# Patient Record
Sex: Female | Born: 1965 | Race: White | Hispanic: No | Marital: Single | Smoking: Former smoker
Health system: Southern US, Community
[De-identification: ages and names within clinical notes are randomized; demographics above are authoritative.]

## PROBLEM LIST (undated history)

## (undated) DIAGNOSIS — N83201 Unspecified ovarian cyst, right side: Secondary | ICD-10-CM

## (undated) DIAGNOSIS — J209 Acute bronchitis, unspecified: Secondary | ICD-10-CM

## (undated) DIAGNOSIS — Z9889 Other specified postprocedural states: Secondary | ICD-10-CM

## (undated) DIAGNOSIS — Q211 Atrial septal defect, unspecified: Secondary | ICD-10-CM

## (undated) DIAGNOSIS — L299 Pruritus, unspecified: Secondary | ICD-10-CM

## (undated) DIAGNOSIS — M7542 Impingement syndrome of left shoulder: Secondary | ICD-10-CM

## (undated) DIAGNOSIS — K429 Umbilical hernia without obstruction or gangrene: Secondary | ICD-10-CM

## (undated) DIAGNOSIS — R52 Pain, unspecified: Secondary | ICD-10-CM

## (undated) DIAGNOSIS — R197 Diarrhea, unspecified: Secondary | ICD-10-CM

## (undated) DIAGNOSIS — R059 Cough, unspecified: Secondary | ICD-10-CM

## (undated) DIAGNOSIS — I639 Cerebral infarction, unspecified: Secondary | ICD-10-CM

## (undated) DIAGNOSIS — E875 Hyperkalemia: Secondary | ICD-10-CM

## (undated) DIAGNOSIS — Q2112 Patent foramen ovale: Secondary | ICD-10-CM

## (undated) DIAGNOSIS — R011 Cardiac murmur, unspecified: Secondary | ICD-10-CM

## (undated) DIAGNOSIS — M549 Dorsalgia, unspecified: Secondary | ICD-10-CM

## (undated) DIAGNOSIS — R569 Unspecified convulsions: Secondary | ICD-10-CM

## (undated) DIAGNOSIS — R0781 Pleurodynia: Secondary | ICD-10-CM

## (undated) DIAGNOSIS — B029 Zoster without complications: Secondary | ICD-10-CM

## (undated) DIAGNOSIS — Z6828 Body mass index (BMI) 28.0-28.9, adult: Secondary | ICD-10-CM

## (undated) DIAGNOSIS — I10 Essential (primary) hypertension: Secondary | ICD-10-CM

## (undated) DIAGNOSIS — F419 Anxiety disorder, unspecified: Secondary | ICD-10-CM

## (undated) DIAGNOSIS — E119 Type 2 diabetes mellitus without complications: Secondary | ICD-10-CM

## (undated) DIAGNOSIS — R519 Headache, unspecified: Secondary | ICD-10-CM

## (undated) DIAGNOSIS — G459 Transient cerebral ischemic attack, unspecified: Secondary | ICD-10-CM

## (undated) DIAGNOSIS — F32A Depression, unspecified: Secondary | ICD-10-CM

## (undated) DIAGNOSIS — J069 Acute upper respiratory infection, unspecified: Secondary | ICD-10-CM

## (undated) DIAGNOSIS — J45909 Unspecified asthma, uncomplicated: Secondary | ICD-10-CM

## (undated) DIAGNOSIS — G4700 Insomnia, unspecified: Secondary | ICD-10-CM

## (undated) DIAGNOSIS — M7742 Metatarsalgia, left foot: Secondary | ICD-10-CM

## (undated) DIAGNOSIS — R05 Cough: Secondary | ICD-10-CM

## (undated) DIAGNOSIS — Z794 Long term (current) use of insulin: Secondary | ICD-10-CM

## (undated) DIAGNOSIS — R319 Hematuria, unspecified: Secondary | ICD-10-CM

## (undated) DIAGNOSIS — M94212 Chondromalacia, left shoulder: Secondary | ICD-10-CM

## (undated) DIAGNOSIS — K219 Gastro-esophageal reflux disease without esophagitis: Secondary | ICD-10-CM

## (undated) DIAGNOSIS — D1803 Hemangioma of intra-abdominal structures: Secondary | ICD-10-CM

## (undated) DIAGNOSIS — M65319 Trigger thumb, unspecified thumb: Secondary | ICD-10-CM

## (undated) DIAGNOSIS — E785 Hyperlipidemia, unspecified: Secondary | ICD-10-CM

## (undated) DIAGNOSIS — F329 Major depressive disorder, single episode, unspecified: Secondary | ICD-10-CM

## (undated) DIAGNOSIS — M940 Chondrocostal junction syndrome [Tietze]: Secondary | ICD-10-CM

## (undated) DIAGNOSIS — R1011 Right upper quadrant pain: Secondary | ICD-10-CM

## (undated) DIAGNOSIS — S79922A Unspecified injury of left thigh, initial encounter: Secondary | ICD-10-CM

## (undated) DIAGNOSIS — N951 Menopausal and female climacteric states: Secondary | ICD-10-CM

## (undated) DIAGNOSIS — J309 Allergic rhinitis, unspecified: Secondary | ICD-10-CM

## (undated) DIAGNOSIS — K469 Unspecified abdominal hernia without obstruction or gangrene: Secondary | ICD-10-CM

## (undated) DIAGNOSIS — K5900 Constipation, unspecified: Secondary | ICD-10-CM

## (undated) DIAGNOSIS — R112 Nausea with vomiting, unspecified: Secondary | ICD-10-CM

## (undated) DIAGNOSIS — E860 Dehydration: Secondary | ICD-10-CM

## (undated) DIAGNOSIS — IMO0001 Reserved for inherently not codable concepts without codable children: Secondary | ICD-10-CM

## (undated) DIAGNOSIS — E782 Mixed hyperlipidemia: Secondary | ICD-10-CM

## (undated) DIAGNOSIS — D259 Leiomyoma of uterus, unspecified: Secondary | ICD-10-CM

## (undated) HISTORY — DX: Menopausal and female climacteric states: N95.1

## (undated) HISTORY — DX: Hyperkalemia: E87.5

## (undated) HISTORY — DX: Dehydration: E86.0

## (undated) HISTORY — DX: Hemangioma of intra-abdominal structures: D18.03

## (undated) HISTORY — DX: Transient cerebral ischemic attack, unspecified: G45.9

## (undated) HISTORY — DX: Unspecified abdominal hernia without obstruction or gangrene: K46.9

## (undated) HISTORY — DX: Right upper quadrant pain: R10.11

## (undated) HISTORY — DX: Type 2 diabetes mellitus without complications: E11.9

## (undated) HISTORY — DX: Mixed hyperlipidemia: E78.2

## (undated) HISTORY — PX: OTHER SURGICAL HISTORY: SHX169

## (undated) HISTORY — DX: Acute upper respiratory infection, unspecified: J06.9

## (undated) HISTORY — DX: Metatarsalgia, left foot: M77.42

## (undated) HISTORY — DX: Dorsalgia, unspecified: M54.9

## (undated) HISTORY — DX: Trigger thumb, unspecified thumb: M65.319

## (undated) HISTORY — DX: Cough, unspecified: R05.9

## (undated) HISTORY — DX: Pruritus, unspecified: L29.9

## (undated) HISTORY — DX: Pleurodynia: R07.81

## (undated) HISTORY — DX: Allergic rhinitis, unspecified: J30.9

## (undated) HISTORY — DX: Leiomyoma of uterus, unspecified: D25.9

## (undated) HISTORY — DX: Unspecified injury of left thigh, initial encounter: S79.922A

## (undated) HISTORY — DX: Insomnia, unspecified: G47.00

## (undated) HISTORY — DX: Hyperlipidemia, unspecified: E78.5

## (undated) HISTORY — PX: FOREIGN BODY REMOVAL: SHX962

## (undated) HISTORY — DX: Diarrhea, unspecified: R19.7

## (undated) HISTORY — DX: Long term (current) use of insulin: Z79.4

## (undated) HISTORY — DX: Hematuria, unspecified: R31.9

## (undated) HISTORY — DX: Zoster without complications: B02.9

## (undated) HISTORY — DX: Umbilical hernia without obstruction or gangrene: K42.9

## (undated) HISTORY — DX: Headache, unspecified: R51.9

## (undated) HISTORY — PX: TRIGGER FINGER RELEASE: SHX641

## (undated) HISTORY — DX: Chondromalacia, left shoulder: M94.212

## (undated) HISTORY — DX: Reserved for inherently not codable concepts without codable children: IMO0001

## (undated) HISTORY — DX: Constipation, unspecified: K59.00

## (undated) HISTORY — DX: Impingement syndrome of left shoulder: M75.42

## (undated) HISTORY — DX: Atrial septal defect: Q21.1

## (undated) HISTORY — DX: Anxiety disorder, unspecified: F41.9

## (undated) HISTORY — DX: Essential (primary) hypertension: I10

## (undated) HISTORY — DX: Chondrocostal junction syndrome (tietze): M94.0

## (undated) HISTORY — DX: Unspecified ovarian cyst, right side: N83.201

## (undated) HISTORY — DX: Atrial septal defect, unspecified: Q21.10

## (undated) HISTORY — DX: Acute bronchitis, unspecified: J20.9

## (undated) HISTORY — DX: Gastro-esophageal reflux disease without esophagitis: K21.9

## (undated) HISTORY — PX: HERNIA REPAIR: SHX51

## (undated) HISTORY — DX: Pain, unspecified: R52

## (undated) HISTORY — DX: Body mass index (BMI) 28.0-28.9, adult: Z68.28

---
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
## 1998-06-03 ENCOUNTER — Encounter: Payer: Self-pay | Admitting: Emergency Medicine

## 1998-06-03 ENCOUNTER — Inpatient Hospital Stay (HOSPITAL_COMMUNITY): Admission: EM | Admit: 1998-06-03 | Discharge: 1998-06-05 | Payer: Self-pay | Admitting: Emergency Medicine

## 1998-06-04 ENCOUNTER — Encounter: Payer: Self-pay | Admitting: Emergency Medicine

## 1999-07-16 ENCOUNTER — Other Ambulatory Visit: Admission: RE | Admit: 1999-07-16 | Discharge: 1999-07-16 | Payer: Self-pay | Admitting: Obstetrics & Gynecology

## 1999-07-24 ENCOUNTER — Encounter: Admission: RE | Admit: 1999-07-24 | Discharge: 1999-10-22 | Payer: Self-pay | Admitting: *Deleted

## 1999-08-12 ENCOUNTER — Encounter (INDEPENDENT_AMBULATORY_CARE_PROVIDER_SITE_OTHER): Payer: Self-pay | Admitting: Specialist

## 1999-08-12 ENCOUNTER — Ambulatory Visit (HOSPITAL_COMMUNITY): Admission: RE | Admit: 1999-08-12 | Discharge: 1999-08-12 | Payer: Self-pay | Admitting: Obstetrics & Gynecology

## 2001-10-13 ENCOUNTER — Ambulatory Visit (HOSPITAL_BASED_OUTPATIENT_CLINIC_OR_DEPARTMENT_OTHER): Admission: RE | Admit: 2001-10-13 | Discharge: 2001-10-13 | Payer: Self-pay | Admitting: Orthopedic Surgery

## 2003-01-28 ENCOUNTER — Emergency Department (HOSPITAL_COMMUNITY): Admission: EM | Admit: 2003-01-28 | Discharge: 2003-01-28 | Payer: Self-pay | Admitting: Emergency Medicine

## 2003-10-04 ENCOUNTER — Other Ambulatory Visit: Admission: RE | Admit: 2003-10-04 | Discharge: 2003-10-04 | Payer: Self-pay | Admitting: Family Medicine

## 2004-01-15 ENCOUNTER — Ambulatory Visit (HOSPITAL_BASED_OUTPATIENT_CLINIC_OR_DEPARTMENT_OTHER): Admission: RE | Admit: 2004-01-15 | Discharge: 2004-01-15 | Payer: Self-pay | Admitting: Orthopedic Surgery

## 2004-01-15 ENCOUNTER — Ambulatory Visit (HOSPITAL_COMMUNITY): Admission: RE | Admit: 2004-01-15 | Discharge: 2004-01-15 | Payer: Self-pay | Admitting: Orthopedic Surgery

## 2005-07-10 ENCOUNTER — Encounter (INDEPENDENT_AMBULATORY_CARE_PROVIDER_SITE_OTHER): Payer: Self-pay | Admitting: *Deleted

## 2005-07-10 ENCOUNTER — Encounter: Admission: RE | Admit: 2005-07-10 | Discharge: 2005-07-10 | Payer: Self-pay | Admitting: Family Medicine

## 2005-07-25 ENCOUNTER — Encounter: Admission: RE | Admit: 2005-07-25 | Discharge: 2005-07-25 | Payer: Self-pay | Admitting: Family Medicine

## 2005-11-29 ENCOUNTER — Emergency Department (HOSPITAL_COMMUNITY): Admission: EM | Admit: 2005-11-29 | Discharge: 2005-11-29 | Payer: Self-pay | Admitting: Emergency Medicine

## 2008-07-25 ENCOUNTER — Inpatient Hospital Stay (HOSPITAL_COMMUNITY): Admission: EM | Admit: 2008-07-25 | Discharge: 2008-07-27 | Payer: Self-pay | Admitting: Emergency Medicine

## 2008-07-25 ENCOUNTER — Ambulatory Visit: Payer: Self-pay | Admitting: Cardiology

## 2008-07-26 ENCOUNTER — Encounter (INDEPENDENT_AMBULATORY_CARE_PROVIDER_SITE_OTHER): Payer: Self-pay | Admitting: *Deleted

## 2008-07-26 ENCOUNTER — Ambulatory Visit: Payer: Self-pay | Admitting: Surgery

## 2008-08-01 ENCOUNTER — Ambulatory Visit (HOSPITAL_COMMUNITY): Admission: RE | Admit: 2008-08-01 | Discharge: 2008-08-01 | Payer: Self-pay | Admitting: Neurology

## 2008-11-16 ENCOUNTER — Ambulatory Visit (HOSPITAL_COMMUNITY): Admission: RE | Admit: 2008-11-16 | Discharge: 2008-11-16 | Payer: Self-pay | Admitting: Cardiology

## 2008-11-16 ENCOUNTER — Encounter: Payer: Self-pay | Admitting: Cardiovascular Disease

## 2008-11-16 ENCOUNTER — Encounter (INDEPENDENT_AMBULATORY_CARE_PROVIDER_SITE_OTHER): Payer: Self-pay | Admitting: Cardiology

## 2009-03-30 DIAGNOSIS — I10 Essential (primary) hypertension: Secondary | ICD-10-CM | POA: Insufficient documentation

## 2009-03-30 DIAGNOSIS — E119 Type 2 diabetes mellitus without complications: Secondary | ICD-10-CM | POA: Insufficient documentation

## 2009-03-30 DIAGNOSIS — G459 Transient cerebral ischemic attack, unspecified: Secondary | ICD-10-CM | POA: Insufficient documentation

## 2009-03-30 DIAGNOSIS — F17200 Nicotine dependence, unspecified, uncomplicated: Secondary | ICD-10-CM | POA: Insufficient documentation

## 2009-04-03 ENCOUNTER — Ambulatory Visit: Payer: Self-pay | Admitting: Cardiovascular Disease

## 2009-04-03 ENCOUNTER — Telehealth: Payer: Self-pay | Admitting: Internal Medicine

## 2009-04-03 DIAGNOSIS — K5900 Constipation, unspecified: Secondary | ICD-10-CM | POA: Insufficient documentation

## 2009-04-03 DIAGNOSIS — R0602 Shortness of breath: Secondary | ICD-10-CM | POA: Insufficient documentation

## 2009-04-05 ENCOUNTER — Telehealth (INDEPENDENT_AMBULATORY_CARE_PROVIDER_SITE_OTHER): Payer: Self-pay | Admitting: *Deleted

## 2009-04-09 ENCOUNTER — Encounter: Admission: RE | Admit: 2009-04-09 | Discharge: 2009-04-09 | Payer: Self-pay | Admitting: Family Medicine

## 2009-04-09 ENCOUNTER — Ambulatory Visit: Payer: Self-pay

## 2009-04-09 ENCOUNTER — Encounter: Payer: Self-pay | Admitting: Cardiovascular Disease

## 2009-04-09 ENCOUNTER — Encounter (INDEPENDENT_AMBULATORY_CARE_PROVIDER_SITE_OTHER): Payer: Self-pay | Admitting: *Deleted

## 2009-04-25 ENCOUNTER — Ambulatory Visit: Payer: Self-pay | Admitting: Internal Medicine

## 2009-04-25 DIAGNOSIS — N643 Galactorrhea not associated with childbirth: Secondary | ICD-10-CM | POA: Insufficient documentation

## 2009-04-26 LAB — CONVERTED CEMR LAB
Prolactin: 10 ng/mL
TSH: 0.94 microintl units/mL (ref 0.35–5.50)

## 2009-04-30 ENCOUNTER — Ambulatory Visit (HOSPITAL_COMMUNITY): Admission: RE | Admit: 2009-04-30 | Discharge: 2009-04-30 | Payer: Self-pay | Admitting: Internal Medicine

## 2009-05-01 ENCOUNTER — Telehealth: Payer: Self-pay | Admitting: Internal Medicine

## 2009-05-11 ENCOUNTER — Ambulatory Visit: Payer: Self-pay | Admitting: Internal Medicine

## 2009-07-20 ENCOUNTER — Encounter (INDEPENDENT_AMBULATORY_CARE_PROVIDER_SITE_OTHER): Payer: Self-pay | Admitting: *Deleted

## 2010-03-19 ENCOUNTER — Encounter: Admission: RE | Admit: 2010-03-19 | Discharge: 2010-03-19 | Payer: Self-pay | Admitting: Family Medicine

## 2010-10-07 ENCOUNTER — Ambulatory Visit: Admit: 2010-10-07 | Payer: Self-pay | Admitting: Family Medicine

## 2010-10-22 ENCOUNTER — Ambulatory Visit (INDEPENDENT_AMBULATORY_CARE_PROVIDER_SITE_OTHER): Payer: BC Managed Care – PPO | Admitting: Family Medicine

## 2010-10-22 ENCOUNTER — Encounter: Payer: Self-pay | Admitting: Family Medicine

## 2010-10-22 DIAGNOSIS — G459 Transient cerebral ischemic attack, unspecified: Secondary | ICD-10-CM

## 2010-10-22 DIAGNOSIS — E109 Type 1 diabetes mellitus without complications: Secondary | ICD-10-CM | POA: Insufficient documentation

## 2010-10-22 DIAGNOSIS — Q211 Atrial septal defect: Secondary | ICD-10-CM

## 2010-10-22 DIAGNOSIS — F411 Generalized anxiety disorder: Secondary | ICD-10-CM | POA: Insufficient documentation

## 2010-10-30 NOTE — Assessment & Plan Note (Signed)
Summary: new to est--[phone]   Vital Signs:  Patient profile:   46 year old female Height:      62 inches Weight:      149 pounds BMI:     27.35 Pulse rate:   66 / minute BP sitting:   130 / 80  (left arm)  Vitals Entered By: Doristine Devoid CMA (October 22, 2010 9:59 AM) CC: new est-    History of Present Illness: 45 yo woman here today to establish care.  previous MD- Larina Bras.  1) Diabetes Type I- dx'd at age 26, using insulin pump.  sees Dr Altheimer.  has proliferative diabetic retinopathy, seeing Dr Cherene Julian at [facility] at least yearly.  vision will fluctuate w/ sugar.  no renal involvement, no neuropathy.  2) ? TIA- 2010, has been following w/ Dr Anne Hahn, had TEE, bubble test and was dx'd w/ PFO.  has no residual effects.  not on cholesterol medication.  3) PFO- seeing Dr Eden Emms.  on 2 ASA daily.  4) Anxiety- on xanax as needed.  has been on SSRIs '2-3 different times'.  effexor 'made me nuts and then i had horrible withdraw sxs'.  feels sxs are currently well controlled.  Preventive Screening-Counseling & Management  Alcohol-Tobacco     Alcohol drinks/day: <1     Smoking Status: quit     Year Quit: 2010  Caffeine-Diet-Exercise     Does Patient Exercise: yes      Sexual History:  currently monogamous.        Drug Use:  former.    Current Medications (verified): 1)  Novolin N 100 Unit/ml Susp (Insulin Isophane Human) .... Pump As Diretced 2)  Aspirin 81 Mg Tbec (Aspirin) .... 2 Tabs By Mouth Once Daily 3)  Ramipril 10 Mg Caps (Ramipril) .... Take One Capsule By Mouth Daily 4)  Multivitamins   Tabs (Multiple Vitamin) .Marland Kitchen.. 1 Tab By Mouth Once Daily 5)  Magnesium 30 Mg Tabs (Magnesium) .... As Needed 6)  Stool Softener 100 Mg Caps (Docusate Sodium) .... As Needed 7)  Alprazolam 0.5 Mg Tabs (Alprazolam) .... Take 1-2 Tablets As Needed  Allergies (verified): 1)  ! * Lantus 2)  ! * Nickel  Past History:  Past medical, surgical, family and social histories  (including risk factors) reviewed, and no changes noted (except as noted below).  Past Medical History: Reviewed history from 04/25/2009 and no changes required. IDDM:  insulin pump Hypertension ?TIA Confusion CONSTIPATION (ICD-564.00) DYSPNEA (ICD-786.05) SMOKER (ICD-305.1) TIA (ICD-435.9) HYPERTENSION (ICD-401.9) DM (ICD-250.00)   Anxiety Disorder Chronic Headaches Seizures  Past Surgical History: Reviewed history from 03/30/2009 and no changes required.  hernia repair   Removal of deep foreign body, right index finger.  Family History: Reviewed history from 04/25/2009 and no changes required. DM- mother Family History of Colon Cancer:MGF Family History of Ovarian Cancer:Paternal Aunt Breast Cancer- no  Social History: Reviewed history from 04/25/2009 and no changes required. in relationship works at [employer] no children 2 cats Former smoker Alcohol Use - yes-2-3x/week socially Daily Caffeine Use---2 or 3 daily  Smoking Status:  quit Does Patient Exercise:  yes Drug Use:  former Sexual History:  currently monogamous  Review of Systems General:  Denies chills, fatigue, fever, malaise, sweats, and weakness. Eyes:  Complains of blurring. ENT:  Denies decreased hearing, nosebleeds, and ringing in ears. CV:  Denies chest pain or discomfort, fainting, fatigue, lightheadness, palpitations, shortness of breath with exertion, swelling of feet, and swelling of hands. Resp:  Denies shortness of  breath and sputum productive. GI:  Denies abdominal pain, nausea, and vomiting. Derm:  Denies changes in color of skin and poor wound healing. Neuro:  Complains of visual disturbances; denies headaches, memory loss, numbness, tingling, and weakness. Psych:  Denies anxiety, depression, easily angered, easily tearful, irritability, and mental problems.  Physical Exam  General:  Well-developed,well-nourished,in no acute distress; alert,appropriate and cooperative throughout  examination Head:  Normocephalic and atraumatic without obvious abnormalities. No apparent alopecia or balding. Eyes:  PERRL, EOMI Nose:  External nasal examination shows no deformity or inflammation. Nasal mucosa are pink and moist without lesions or exudates. Mouth:  MMM Neck:  No deformities, masses, or tenderness noted. Lungs:  Normal respiratory effort, chest expands symmetrically. Lungs are clear to auscultation, no crackles or wheezes. Heart:  Normal rate and regular rhythm. S1 and S2 normal without gallop, murmur, click, rub or other extra sounds. Abdomen:  soft, NT/ND, +BS Pulses:  +2 carotid, radial, DP Extremities:  No clubbing, cyanosis, edema, or deformity noted with normal full range of motion of all joints.   Neurologic:  alert & oriented X3, cranial nerves II-XII intact, sensation intact to light touch, gait normal, and DTRs symmetrical and normal.   Skin:  turgor normal and color normal.   Cervical Nodes:  No lymphadenopathy noted Psych:  Cognition and judgment appear intact. Alert and cooperative with normal attention span and concentration. No apparent delusions, illusions, hallucinations   Impression & Recommendations:  Problem # 1:  DIABETES MELLITUS, TYPE I (ICD-250.01) Assessment New following w/ Endo.  doing well.  asked pt to have Dr Altheimer send his office notes and labs so that i can follow along. Her updated medication list for this problem includes:    Novolin N 100 Unit/ml Susp (Insulin isophane human) .Marland Kitchen... Pump as diretced    Aspirin 81 Mg Tbec (Aspirin) .Marland Kitchen... 2 tabs by mouth once daily    Ramipril 10 Mg Caps (Ramipril) .Marland Kitchen... Take one capsule by mouth daily  Problem # 2:  TIA (ICD-435.9) following w/ Dr Anne Hahn.  no residual defects.  will need to review notes from previous MD to determine control of risk factors (lipids) Her updated medication list for this problem includes:    Aspirin 81 Mg Tbec (Aspirin) .Marland Kitchen... 2 tabs by mouth once daily  Problem #  3:  PATENT FORAMEN OVALE (ICD-745.5) Assessment: New following w/ Dr Eden Emms.  on 2 ASA daily as prophylaxis.  Problem # 4:  ANXIETY STATE, UNSPECIFIED (ICD-300.00) Assessment: New pt feels sxs are well controlled.  not interested in controller medication at this time.  will follow. Her updated medication list for this problem includes:    Alprazolam 0.5 Mg Tabs (Alprazolam) .Marland Kitchen... Take 1-2 tablets as needed  Complete Medication List: 1)  Novolin N 100 Unit/ml Susp (Insulin isophane human) .... Pump as diretced 2)  Aspirin 81 Mg Tbec (Aspirin) .... 2 tabs by mouth once daily 3)  Ramipril 10 Mg Caps (Ramipril) .... Take one capsule by mouth daily 4)  Multivitamins Tabs (Multiple vitamin) .Marland Kitchen.. 1 tab by mouth once daily 5)  Magnesium 30 Mg Tabs (Magnesium) .... As needed 6)  Stool Softener 100 Mg Caps (Docusate sodium) .... As needed 7)  Alprazolam 0.5 Mg Tabs (Alprazolam) .... Take 1-2 tablets as needed  Patient Instructions: 1)  Plan on your complete physical in May 2)  Please have Dr Altheimer send me copies of labs and office notes 3)  You look great! 4)  Call with any questions or concerns 5)  Welcome!  We're glad to have you!!!   Orders Added: 1)  New Patient Level III [99203]   Immunization History:  Pneumovax Immunization History:    Pneumovax:  historical (09/08/2008)   Immunization History:  Pneumovax Immunization History:    Pneumovax:  Historical (09/08/2008)

## 2010-10-31 ENCOUNTER — Encounter (INDEPENDENT_AMBULATORY_CARE_PROVIDER_SITE_OTHER): Payer: Self-pay | Admitting: *Deleted

## 2010-11-05 NOTE — Miscellaneous (Signed)
  Clinical Lists Changes  Observations: Added new observation of TD BOOSTER: Historical (10/20/2010 15:08)      Immunization History:  Tetanus/Td Immunization History:    Tetanus/Td:  historical (10/20/2010)

## 2010-12-19 LAB — GLUCOSE, CAPILLARY
Glucose-Capillary: 103 mg/dL — ABNORMAL HIGH (ref 70–99)
Glucose-Capillary: 79 mg/dL (ref 70–99)

## 2011-01-21 NOTE — H&P (Signed)
NAMEMARVIN, Natalie Ware                  ACCOUNT NO.:  [id number]   MEDICAL RECORD NO.:  [id number]          PATIENT TYPE:  EMS   LOCATION:  ED                           [facility]   PHYSICIAN:  Estelle Grumbles, MDDATE OF BIRTH:  01/22/1966   DATE OF ADMISSION:  07/25/2008  DATE OF DISCHARGE:                              HISTORY & PHYSICAL   PRIMARY CARE PHYSICIAN:  Dr. Smith Mince.   CHIEF COMPLAINT:  On and off confusion.   HISTORY OF PRESENT ILLNESS:  Natalie Ware is a 45 year old female with a  history of hypertension, insulin dependent diabetes mellitus, who  presented to the emergency room with off and on confusion, imbalance and  right upper extremity numbness.  According to the patient, she started  noticing symptoms yesterday afternoon when she was talking to her  friend.  She suddenly started talking inappropriately.  She does not  remember what she talked about at that time.  Subsequently, her friend  told her she was talking inappropriately and she does not remember what  she was talking.  At that time, she also started having right upper  extremity numbness.  She had some imbalance at that time.  The symptoms  have resolved and she came back to her baseline.  However, she does have  an on and off imbalance and the right upper extremity numbness was  continuous.  She was also feeling forgetful today.  As the symptoms were  persistent, she presented to the emergency room.  The patient denies  having any fevers.  No headache, dizziness, light-headedness.   REVIEW OF SYSTEMS:  As per the history of present illness.  The patient  denies having any dysphagia, dysarthria.  No visual or auditory  symptoms.  Denies having any neck pain.  No chest pain.  No dyspnea.  No  cough.  No orthopnea.  No nocturnal dyspnea.  Denies having any  abdominal pain.  No nausea or vomiting.  Denies having any dysuria.  No  frequency or chest heaviness.  Denies having any constipation or  diarrhea.  No myalgia or arthralgias.  Denies any weakness, tingling, or  numbness in her lower extremities.  Does have numbness in the right  upper extremity.  Other review of systems were asked and negative.   PAST HISTORY:  Hypertension, insulin dependent diabetes mellitus for the  past 30 years.   SOCIAL HISTORY:  She usually smokes 1/2 to 1 pack of cigarettes a day  for the past 1-1/2 years.  She used to smoke sporadically.  She also  drinks alcohol off and on.  Denies any drug use.   FAMILY HISTORY:  Her mother has diabetes.  Denies any significant  medical issues.   HOME MEDICATIONS:  1. She was taking insulin pump.  2. Altace 5 mg daily.  3. Birth control pills.   PHYSICAL EXAMINATION:  CONSTITUTIONAL:  The patient is well developed,  well nourished NAD.  VITAL SIGNS:  Temperature 98.8, pulse 101, respirations 18, blood  pressure 156/91.  HEENT:  Normocephalic, atraumatic.  Pupils are equal, round, and  reactive to  light and accommodation.  No icterus noted.  Oral cavity,  moist oral mucosa.  NECK:  Supple.  No JVD was noted.  CHEST:  Bilateral air entry.  No crackles or wheezes.  HEART:  S1, S2.  Regular rate and rhythm.  No murmurs were noted.  ABDOMEN:  Soft.  Bowel sounds positive.  Nontender, nondistended.  No  guarding or rigidity.  EXTREMITIES:  No edema.  CNS:  No focal motor or neuro deficits.  Motor is 5/5 in both upper and  lower extremities.  Cranial nerves 2-12 are intact.  Sensory, does have  decreased sensation in the right upper extremity.  Deep tendon reflexes  are 1+ and equal.  Finger to nose tests are normal.   LABS:  White blood cell count 11.2, hemoglobin 14.3, hematocrit 42.5,  and platelets 292,000.  Sodium 136, potassium 3.4, chloride 102, bicarb  25, glucose 300, BUN 13, creatinine 0.84, CK-MB less than 1, troponin  less than 0.05.  Urine hCG is negative.  Urine drug screen is positive  for tetrahydrocannabinol.  Urinalysis negative for  leukocyte esterase  and nitrate.  EKG sinus rhythm, no acute ST-T wave changes.  CT head  reveals normal study.  No acute process radiographically.   IMPRESSION:  Off and on mental status changes with right upper extremity  sensory changes, likely secondary to transient ischemic attack.  The  patient might have cerebrovascular accident.  No significant neurologic  defects were noted at this time.  Will obtain an MRI of the brain, echo  carotid Doppler.  Will start the patient on aspirin.  Will obtain  fasting lipid profile.  Continue the insulin pump and follow Glucometer  prior to meals and at bedtime, and give subcutaneous insulin as needed.  Supplement her fluids as needed.  Will also obtain ammonia levels.  If  the patient continues to have mental status changes, will need to  consider neurological evaluation.      Estelle Grumbles, MD  Electronically Signed     TP/MEDQ  D:  07/25/2008  T:  07/25/2008  Job:  [id number]

## 2011-01-21 NOTE — Consult Note (Signed)
Natalie Ware, Natalie Ware                  ACCOUNT NO.:  [id number]   MEDICAL RECORD NO.:  [id number]          PATIENT TYPE:  INP   LOCATION:  1442                         FACILITY:  [facility]   PHYSICIAN:  Marlan Palau, M.D.  DATE OF BIRTH:  08-02-1966   DATE OF CONSULTATION:  07/26/2008  DATE OF DISCHARGE:                                 CONSULTATION   CHIEF COMPLAINT:  Paresthesias of right arm and altered mental status.   REASON FOR CONSULTATION:  Paresthesias of right arm.   HISTORY OF PRESENT ILLNESS:  This is a 45 year old female with history  of hypertension, diabetes with insulin pump.  She was admitted to the ED  of [facility] on 11/17 secondary to altered mental status, imbalance,  and right upper extremity numbness.  The patient states that on 11/16  she was at work and she works at [employer].  She was on the floor hanging  lingerie when she felt a significant amount of dizziness and imbalance  for approximately 1 minute.  The patient states it was severe enough  that she had to hold onto objects around her to keep her balance.  This  period last for approximately a minute.  The patient states during this  time her right leg also felt very heavy; however she was able to move it  at that time.  Approximately 30 minutes later she was at the cash  register looking down and had another incident of significant dizziness  that lasted for 30 seconds.  She states after this episode she noticed  that she was having right arm tingling and burning that extended from  her fingertips up to her underarm.  She stated that it was more  significant at her palmar region and fingertips than at her proximal  aspect of her right arm.  The patient checked her blood glucose at that  time and it was 200.  The patient also states that during that first  attack her coworkers noted she was saying abnormal words that she does  not remember saying.  Although she has no history of seizure, she has  had a  hypoglycemic seizure event.  Later that day the patient had driven  home with no focal neuro deficits other than her right arm numbness and  tingling.  The patient states that she had no loss of consciousness  during these events.  Also she states she had not been getting up from a  stoop position or getting up from a seated or a lying position and her  blood pressure remained approximately 140/60 at that time as she took it  when she got home.  On November 17 she woke up, noticed that she had a  couple more dizzy spells and her right arm had not improved and decided  to drive to [facility] ER.  While she has been in the hospital she said  she has not had any of these events.  She states that her blood glucose  ranges from 100s up to high 200s; however she does have an hbA1c of  7.7.  She does smoke THC recreationally, however she stated that she had  THC on the night of the 16th which was post numbness and tingling and  dizzy spells.   PAST MEDICAL HISTORY:  Significant for:  1. Hypertension.  2. Diabetes type 1 insulin-dependent.  Hypoglycemic events in the      past.   MEDICATIONS:  1. Altace 5 mg every day.  2. Insulin pump.  3. Camellia oral.  4. While in the hospital Reglan 10 mg IV every 8 hours.   ALLERGIES:  LANTUS INSULIN.   SOCIAL HISTORY:  She does smoke approximately 1/2 to a pack a day.  She  has been doing this for approximately 5 years.  Alcohol she drinks  periodically and illicit drugs as stated she does partake in [facility].   REVIEW OF SYSTEMS:  Were negative with the exception of above.   PHYSICAL EXAM:  Blood pressure at time of interview was 118/75 and has  remained within 120-130 systolic and 75-80 diastolic during her hospital  stay.  NIH scale showed a score of 1.  Pulse 65.  Respirations 18 and  temperature 98.3.  MENTAL STATUS:  She is alert and oriented x3.  She can carry out 1, 2  and 3 step commands without any difficulty.  CRANIAL NERVES:  Pupils   equal, round, reactive, accommodating to light.  Conjugate gaze.  Extraocular muscles are intact.  Visual fields are fully intact.  Tongue  is midline.  Face is symmetrical.  Uvula is midline.  She has no  aphasia, dysarthria, slurred speech at this time.  Sensation V1-V3 is  full.  Head shrug, shoulder turn is normal.  Coordination, finger-to-  nose, heel-to-shin, fine motor movements normal.  Gait is normal at this  time.  Motor, she has full strength 5 out of 5 globally.  She has no  tremors.  No clonus.  No asterixis.  She has good bulk and tone.   Deep tendon reflexes are 2+ bilateral upper extremity,  3+ bilateral at  the patella bilateral downgoing toes and 1+ bilateral  Achilles__________drift.  She has negative drift bilaterally upper  extremities or lower extremities.  Sensation, she states that she has  decreased sensation to pinprick only over her right arm and this is more  pronounced in the palm aspect of her right hand and become more  sensitive as you go proximally up her right arm to her jaw line.  At her  jaw line she states that bilaterally are equal.  As stated she has  decreased sensation on her right arm compared to her left arm and the  decreased sensation on her right arm is more pronounced in the palmar  aspect of her right hand.  All other areas on her body are normal to  pinprick, light touch, and vibration.   LABS:  At this time, PT/INR and PTT is 13.2, 1.0 and 27, hbA1c is 7.7,  alkaline phosphatase 35, AST and ALTs are normal.  UA showed positive  for glucose, no infection.  Sodium 138, potassium 3.8, chloride 106, CO2  is 24, BUN 11, creatinine 0.79, glucose is down from initial 300 when  she was admitted to 133.  White blood cell count is 11.2, platelets 292,  hemoglobin and hematocrit 14.3 and 42.5, triglycerides 84, cholesterol  128, HDL 34, LDL 77.  B12 is 184 and urine drug screen was positive for  THC.  Imaging MRA showed no lesion, stenosis or  aneurysm.  There was an  abnormality of no identified left PICA, however, this could be an  anatomical variation.  MRI of the head showed no ischemic, within normal  limits.  CT of the head showed normal study.  Carotid Doppler  preliminary showed no ICA stenosis.  A 2-D echo is pending.   ASSESSMENT:  The patient's symptoms could be secondary transient  ischemic attack or seizure as patient does not have a history of  migraines.  Images present show no sign of cerebrovascular accident  which makes a seizure very possible; however cannot rule out transient  ischemic attack.  At this time we will obtain an EEG and a homocystine  level while in the hospital.  If her numbness continuous in her right  arm, could consider nerve conduction velocity  test outpatient.  Most likely this is not a median nerve carpal tunnel  syndrome as the patient states that it was a sudden onset of numbness  and tingling throughout her whole arm and not just in the median nerve  distribution.  I will discuss all these findings with Dr. Anne Hahn and  will continue to follow the patient while she is in hospital.     ______________________________  Felicie Morn, PA-C      C. Lesia Sago, M.D.  Electronically Signed    DS/MEDQ  D:  07/26/2008  T:  07/26/2008  Job:  [id number]   cc:   Cherylann Parr TEAM F

## 2011-01-21 NOTE — Procedures (Signed)
EEG NUMBER:  [id number].   DATE OF STUDY:  July 19, 2008.   ORDERED BY:  Marlan Palau, MD   This is a 45 year old woman with a history of diabetes and a seizure 10  years ago, who has had 2 episodes of dizziness along with numbness in  the right arm, confusion, and speech problems.   MEDICATIONS:  Xanax, Tylenol, aspirin, B12 supplement, Altace, and  Humalog.   This was a routine 17-channel EEG with one channel devoted to EKG  utilizing International 10/20 Lead Placement System.  The patient was  described as being awake and drowsy clinically.  She also appears to be  awake and drowsy electrographically.  The background is consistent with  somewhat low-amplitude 11-Hz alpha activity, which is predominant in the  posterior head regions and reactive to eye opening.  During periods of  drowsiness, there was attenuation of the background with some vertex  sharp activity and occasional slowing.  No interhemispheric asymmetry is  identified.  Both hyperventilation and photic stimulation were  performed, but did not produce any significant change in the background  activity.  EKG monitor reveals relatively regular rhythm with a rate of  78 beats per minute.   CONCLUSION:  Essentially normal awake and drowsy EEG without seizure  activities during the course of today's recording.  Mild diffuse beta  activity is seen throughout, which is probably reflective of Xanax.  Clinical correlation is recommended.      Catherine A. Orlin Hilding, M.D.  Electronically Signed     EAV:[id number]  D:  08/01/2008 16:26:08  T:  08/02/2008 03:42:49  Job #:  [id number]

## 2011-01-24 NOTE — Op Note (Signed)
NAME:  Natalie Ware, Natalie Ware                            ACCOUNT NO.:  [id number]   MEDICAL RECORD NO.:  [id number]                   PATIENT TYPE:  AMB   LOCATION:  DSC                                  FACILITY:  [facility]   PHYSICIAN:  Harvie Junior, M.D.                DATE OF BIRTH:  [birth year]   DATE OF PROCEDURE:  01/15/2004  DATE OF DISCHARGE:                                 OPERATIVE REPORT   PREOPERATIVE DIAGNOSIS:  Retained foreign body, right index.   POSTOPERATIVE DIAGNOSIS:  Retained foreign body, right index.   OPERATION PERFORMED:  Removal of deep foreign body, right index finger.   SURGEON:  Harvie Junior, M.D.   ASSISTANT:  Marshia Ly, P.A.   ANESTHESIA:  General via IV and a regional finger block.   INDICATIONS FOR PROCEDURE:  The patient is a 45 year old female with a  history of having a pencil lead jammed into her finger about 20 years ago.  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  Because of continued complaints of pain and  palpational pain, the patient was taken to the operating room for removal of  this foreign body that could be seen on x-ray.   DESCRIPTION OF PROCEDURE:  The patient was taken to the operating room.  After adequate anesthesia was obtained with a digital block, the patient was  placed supine on the operating table.  The right hand was then prepped and  draped in the usual sterile fashion.  Following this, a curved incision was  made over the index finger.  Subcutaneous tissue dissected down to the level  of the deep structures.  A significant amount of what appeared to be sort of  a mud type material was encountered.  Upon continuing to probe in this area,  a large bursal sac came out into the wound.  This was debrided.  This was  opened on the back table and within the sac, the pencil lead was contained.  At this point the wound was copiously irrigated.  Small amounts more of this  mud appearing material which was probably break down products of graphite  were removed from the finger.  Copious irrigation then was under taken and  then closure of the wound with 4-0 nylon interrupted suture.  Sterile  compressive dressing was applied.  The patient was taken to the recovery  room where she was noted to be in satisfactory condition.  The estimated  blood loss for this procedure was none.                                               Harvie Junior, M.D.    Ranae Plumber  D:  01/15/2004  T:  01/16/2004  Job:  [id number]

## 2011-01-24 NOTE — Op Note (Signed)
[place]. [facility]  Patient:    Natalie Ware, Natalie Ware Visit Number: [id number] MRN: [id number]          Service Type: DSU Location: [facility] Attending Physician:  Milly Jakob Dictated by:   Harvie Junior, M.D. Proc. Date: 10/13/01 Admit Date:  10/13/2001                             Operative Report  PREOPERATIVE DIAGNOSES: 1. Trigger finger, right ring. 2. 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  POSTOPERATIVE DIAGNOSES: 1. Trigger finger, right ring. 2. 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  OPERATION: 1. Right long finger trigger finger release. 2. Injection of extensor carpi ulnaris tendon under anesthesia. 3. Injection of wrist joint under anesthesia.  SURGEON:  Harvie Junior, M.D.  ASSISTANT:  Currie Paris. Thedore Mins.  ANESTHESIA:  IV regional.  INDICATIONS:  Natalie Ware is a 45 year old female with a long history of having had triggering of her right ring finger.  She ultimately had tried anti-inflammatory medications, injection therapy, swelling modification and none of this was successful in controlling her triggering and because of this, she is brought to the operating room for trigger finger release.  During the work-up for trigger finger she was noted to have some fairly significant pain over the ECU tendon and over the triangular fibrocartilage.  There was some question in our mind as to whether as to whether this needed MRI for workup but ultimately felt that probably if it was a positive work-up that the patient would be injected.  Because the patient was coming here for surgery, it was felt that this was most appropriate to be done under anesthesia.  DESCRIPTION OF PROCEDURE:  The patient was brought to the operating room and after satisfactory anesthesia was advanced under IV regional, the patient was placed supine on the operating table.  The right arm was  then prepped and draped in the usual sterile fashion.  Following this, a linear incision was made over the ring finger.  The subcutaneous tissue was dissected down to the level of the tendon sheath which was clearly identified, retractors put in place to protect the digital nerve and digital artery.  At this point, an incision was made in the pulley and the pulley was divided proximally and distally.  At this point, the wound was copiously irrigated and suctioned dry. The tendons were pulled out of the wound to make sure that there was no tendency towards triggering or any hesitancy on the tendon at this point.  Following this, the patients wound was closed with 4-0 nylon interrupted suture.  The ECU tendon was then identified and injected with 3 cc of mixture of lidocaine and 1 cc of 80 mg of Depo-Medrol.  The wrist joint was then identified over the triangle of fibrocartilage in what would be the sixth on our portal.  Then 2 cc of lidocaine and 80 mg of Depo-Medrol was injected into the wrist joint under anesthesia.  At this point, a sterile compressive dressing was applied, and the patient was taken to the recovery room where she was noted to be in satisfactory condition.  Estimated blood loss for the procedure was negligible. Dictated by:   Harvie Junior, M.D. Attending Physician:  Milly Jakob DD:  10/13/01 TD:  10/13/01 Job: [id number]

## 2011-02-17 ENCOUNTER — Ambulatory Visit (INDEPENDENT_AMBULATORY_CARE_PROVIDER_SITE_OTHER): Payer: BC Managed Care – PPO | Admitting: Family Medicine

## 2011-02-17 ENCOUNTER — Encounter: Payer: Self-pay | Admitting: Family Medicine

## 2011-02-17 VITALS — BP 140/88 | Temp 99.2°F | Wt 152.4 lb

## 2011-02-17 DIAGNOSIS — M549 Dorsalgia, unspecified: Secondary | ICD-10-CM

## 2011-02-17 DIAGNOSIS — R079 Chest pain, unspecified: Secondary | ICD-10-CM | POA: Insufficient documentation

## 2011-02-17 DIAGNOSIS — R110 Nausea: Secondary | ICD-10-CM

## 2011-02-17 MED ORDER — TRAMADOL HCL 50 MG PO TABS: 50.0000 mg | ORAL_TABLET | Freq: Four times a day (QID) | ORAL | Status: AC | PRN

## 2011-02-17 MED ORDER — PROMETHAZINE HCL 25 MG PO TABS: 25.0000 mg | ORAL_TABLET | Freq: Four times a day (QID) | ORAL | Status: AC | PRN

## 2011-02-17 NOTE — Patient Instructions (Signed)
Take the Tramadol as needed for pain Use the phenergan as needed for nausea We'll notify you of your ultrasound appt and your lab results If your pain worsens or changes- please go to the ER Hang in there!

## 2011-02-17 NOTE — Progress Notes (Signed)
  Subjective:    Patient ID: Natalie Ware, female    DOB: 1966/08/05, 45 y.o.   MRN: [id number]  HPI Back pain- sxs started 7-10 days ago.  Pain is intermittant.  Always under R shoulder blade.  Last 2 nights has kept pt awake.  Has radiated to R upper arm 'once or twice'.  Today 'is stabbing me in my breast'.  + nausea.  Pain is worse at night.  Alternating tylenol and ibuprofen w/out relief.  Denies vomiting, fever, diarrhea.  Pain was severe w/ eating high fat dinner last night.  No relief w/ Zantac.  Denies SOB.  No hx of similar.  Denies recent change in activity or heavy lifting.   Review of Systems For ROS see HPI     Objective:   Physical Exam  Constitutional: She is oriented to person, place, and time. She appears well-developed and well-nourished. No distress.  Neck: Normal range of motion. Neck supple. No thyromegaly present.  Cardiovascular: Normal rate, regular rhythm and intact distal pulses.   Pulmonary/Chest: Effort normal and breath sounds normal. No respiratory distress. She has no wheezes. She has no rales.  Abdominal: Soft. Bowel sounds are normal. She exhibits no distension. There is no tenderness. There is no rebound.  Musculoskeletal: Normal range of motion. She exhibits tenderness (over R scapula). She exhibits no edema.  Lymphadenopathy:    She has no cervical adenopathy.  Neurological: She is alert and oriented to person, place, and time. No cranial nerve deficit. Coordination normal.  Skin: Skin is warm and dry.  Psychiatric:       anxious          Assessment & Plan:

## 2011-02-18 ENCOUNTER — Encounter: Payer: Self-pay | Admitting: Family Medicine

## 2011-02-18 LAB — HEPATIC FUNCTION PANEL
Albumin: 4.5 g/dL (ref 3.5–5.2)
Bilirubin, Direct: 0.2 mg/dL (ref 0.0–0.3)
Total Protein: 7.7 g/dL (ref 6.0–8.3)

## 2011-02-18 LAB — CBC WITH DIFFERENTIAL/PLATELET
Basophils Relative: 0.2 % (ref 0.0–3.0)
Eosinophils Absolute: 0.6 10*3/uL (ref 0.0–0.7)
Eosinophils Relative: 6.4 % — ABNORMAL HIGH (ref 0.0–5.0)
HCT: 40.1 % (ref 36.0–46.0)
Lymphocytes Relative: 31.9 % (ref 12.0–46.0)
Lymphs Abs: 3 10*3/uL (ref 0.7–4.0)
Monocytes Relative: 7.4 % (ref 3.0–12.0)
Neutrophils Relative %: 54.1 % (ref 43.0–77.0)
Platelets: 233 10*3/uL (ref 150.0–400.0)
RDW: 13.2 % (ref 11.5–14.6)
WBC: 9.4 10*3/uL (ref 4.5–10.5)

## 2011-02-18 LAB — BASIC METABOLIC PANEL
Chloride: 101 mEq/L (ref 96–112)
GFR: 85.28 mL/min (ref >60.00)
Potassium: 4.4 mEq/L (ref 3.5–5.1)
Sodium: 136 mEq/L (ref 135–145)

## 2011-02-18 NOTE — Assessment & Plan Note (Signed)
Given pt's risk factors- DMI, hx of TIA- pt's CP is concerning.  EKG normal w/out obvious cause of CP identified.  Pt to return or go to ER if sxs change or worsen

## 2011-02-18 NOTE — Assessment & Plan Note (Signed)
Start phenergan prn for nausea.  Check labs to r/o possible infxn, electrolyte abnormality, liver/gallbladder issue.  Abd [place].

## 2011-02-18 NOTE — Assessment & Plan Note (Signed)
May be musculoskeletal but may actually be related to gallbladder given that her sxs seemed to worsen after fatty meal.  Start Tramadol for pain.  Get abd [place] to assess gallbladder.  Reviewed supportive care and red flags that should prompt return.  Pt expressed understanding and is in agreement w/ plan.

## 2011-02-19 ENCOUNTER — Encounter: Payer: Self-pay | Admitting: *Deleted

## 2011-02-19 ENCOUNTER — Ambulatory Visit
Admission: RE | Admit: 2011-02-19 | Discharge: 2011-02-19 | Disposition: A | Discharge status: Home / Self Care | Payer: BC Managed Care – PPO | Source: Ambulatory Visit | Attending: Family Medicine | Admitting: Family Medicine

## 2011-02-19 DIAGNOSIS — R110 Nausea: Secondary | ICD-10-CM

## 2011-02-25 ENCOUNTER — Telehealth: Payer: Self-pay | Admitting: *Deleted

## 2011-02-25 DIAGNOSIS — M549 Dorsalgia, unspecified: Secondary | ICD-10-CM

## 2011-02-25 NOTE — Telephone Encounter (Signed)
Pt advise of dr Beverely Low comments and states that she will try the ortho even though she still feels it is not muscle related. referral put in awaiting appt info.

## 2011-02-25 NOTE — Telephone Encounter (Signed)
Discuss with patient. Pt decline referral and states that it is not muscular so she does not want to see orth. Pt was very emotional on the phone when advise of your recommendation. Please advise

## 2011-02-25 NOTE — Telephone Encounter (Signed)
While waiting on ortho, we can order a thoracic spine xray (dx back pain) to assess for cause of pain.  Dr Laury Axon stated that with a normal xray she would be able to do an evaluation and possible adjustment if this is something she is interested in.

## 2011-02-25 NOTE — Telephone Encounter (Signed)
Can refer to ortho/sports med for evaluation and tx

## 2011-02-25 NOTE — Telephone Encounter (Signed)
Not sure who pt would like to see.  She had a normal abdominal US so it would not make sense to send her to GI unless she feels strongly it is GI related.  Pain is R sided so this is not likely to be cardiac and she had normal EKG in office.  Unless pain is occuring only w/ deep breathing and/or cough- pulmonary is not an option.  When she was here in the office, her pain was over her R scapula (shoulder blade).  The appropriate referral for that would be ortho.  If her sxs are new or different from when she was here she will need another visit.  Or there is always the option of UC or ER.

## 2011-02-28 ENCOUNTER — Other Ambulatory Visit: Payer: Self-pay | Admitting: Family Medicine

## 2011-02-28 NOTE — Telephone Encounter (Signed)
This appears to not have been given by our office before. Pt was seen 02/17/11. Please advise.

## 2011-02-28 NOTE — Telephone Encounter (Signed)
Rx faxed

## 2011-06-10 LAB — GLUCOSE, CAPILLARY
Glucose-Capillary: 110 — ABNORMAL HIGH
Glucose-Capillary: 113 — ABNORMAL HIGH
Glucose-Capillary: 133 — ABNORMAL HIGH
Glucose-Capillary: 134 — ABNORMAL HIGH
Glucose-Capillary: 298 — ABNORMAL HIGH
Glucose-Capillary: 88

## 2011-06-10 LAB — DIFFERENTIAL
Basophils Absolute: 0.2 — ABNORMAL HIGH
Basophils Relative: 2 — ABNORMAL HIGH
Eosinophils Absolute: 0.5
Neutro Abs: 7.6
Neutrophils Relative %: 69

## 2011-06-10 LAB — BASIC METABOLIC PANEL
BUN: 13
CO2: 25
Calcium: 9
Chloride: 102
Creatinine, Ser: 0.84
Glucose, Bld: 300 — ABNORMAL HIGH

## 2011-06-10 LAB — URINALYSIS, ROUTINE W REFLEX MICROSCOPIC
Bilirubin Urine: NEGATIVE
Glucose, UA: >1000 — AB
Hgb urine dipstick: NEGATIVE
Ketones, ur: NEGATIVE
Nitrite: NEGATIVE
Specific Gravity, Urine: 1.026
pH: 6

## 2011-06-10 LAB — HEMOGLOBIN A1C
Hgb A1c MFr Bld: 7.7 — ABNORMAL HIGH
Mean Plasma Glucose: 174

## 2011-06-10 LAB — TSH: TSH: 0.76

## 2011-06-10 LAB — RAPID URINE DRUG SCREEN, HOSP PERFORMED
Amphetamines: NOT DETECTED
Cocaine: NOT DETECTED
Opiates: NOT DETECTED
Tetrahydrocannabinol: POSITIVE — AB

## 2011-06-10 LAB — COMPREHENSIVE METABOLIC PANEL
Albumin: 3.3 — ABNORMAL LOW
Alkaline Phosphatase: 35 — ABNORMAL LOW
BUN: 11
Calcium: 8.4
Glucose, Bld: 191 — ABNORMAL HIGH
Potassium: 3.8
Sodium: 138
Total Protein: 5.9 — ABNORMAL LOW

## 2011-06-10 LAB — CBC
MCHC: 33.7
MCV: 95.3
RDW: 13.3

## 2011-06-10 LAB — LIPID PANEL
Total CHOL/HDL Ratio: 3.8
VLDL: 17

## 2011-06-10 LAB — APTT: aPTT: 27

## 2011-06-10 LAB — PROTIME-INR
INR: 1
Prothrombin Time: 13.2

## 2011-06-10 LAB — HOMOCYSTEINE: Homocysteine: 8

## 2011-06-10 LAB — URINE MICROSCOPIC-ADD ON

## 2011-06-10 LAB — POCT CARDIAC MARKERS
CKMB, poc: <1 — ABNORMAL LOW
Myoglobin, poc: 60.5

## 2011-06-10 LAB — VITAMIN B12: Vitamin B-12: 184 — ABNORMAL LOW (ref 211–911)

## 2011-07-02 ENCOUNTER — Encounter: Payer: BC Managed Care – PPO | Admitting: Family Medicine

## 2011-07-23 ENCOUNTER — Encounter: Payer: Self-pay | Admitting: Family Medicine

## 2011-07-24 ENCOUNTER — Encounter (INDEPENDENT_AMBULATORY_CARE_PROVIDER_SITE_OTHER): Payer: BC Managed Care – PPO | Admitting: Family Medicine

## 2011-07-24 ENCOUNTER — Other Ambulatory Visit: Payer: Self-pay | Admitting: *Deleted

## 2011-07-24 MED ORDER — ALPRAZOLAM 0.5 MG PO TABS: 0.5000 mg | ORAL_TABLET | Freq: Two times a day (BID) | ORAL | Status: DC | PRN

## 2011-07-24 NOTE — Telephone Encounter (Signed)
Manually faxed rx for xanax woth no refills to [facility] [address]

## 2011-07-24 NOTE — Telephone Encounter (Signed)
This pt came in today to be seen by MD Tabori, and had to be rescheduled. She advised that she is completely out of her xanax. Last OV 02-17-11 and last refill 02-28-11.

## 2011-07-24 NOTE — Telephone Encounter (Signed)
Refill x1 

## 2011-09-08 ENCOUNTER — Encounter: Payer: Self-pay | Admitting: Family Medicine

## 2011-09-08 ENCOUNTER — Ambulatory Visit (INDEPENDENT_AMBULATORY_CARE_PROVIDER_SITE_OTHER): Payer: BC Managed Care – PPO | Admitting: Family Medicine

## 2011-09-08 ENCOUNTER — Other Ambulatory Visit (HOSPITAL_COMMUNITY)
Admission: RE | Admit: 2011-09-08 | Discharge: 2011-09-08 | Disposition: A | Discharge status: Home / Self Care | Payer: BC Managed Care – PPO | Source: Ambulatory Visit | Attending: Family Medicine | Admitting: Family Medicine

## 2011-09-08 DIAGNOSIS — J40 Bronchitis, not specified as acute or chronic: Secondary | ICD-10-CM | POA: Insufficient documentation

## 2011-09-08 DIAGNOSIS — Z124 Encounter for screening for malignant neoplasm of cervix: Secondary | ICD-10-CM | POA: Insufficient documentation

## 2011-09-08 DIAGNOSIS — Z01419 Encounter for gynecological examination (general) (routine) without abnormal findings: Secondary | ICD-10-CM | POA: Insufficient documentation

## 2011-09-08 LAB — HEPATIC FUNCTION PANEL
Albumin: 3.9 g/dL (ref 3.5–5.2)
Alkaline Phosphatase: 51 U/L (ref 39–117)

## 2011-09-08 LAB — TSH: TSH: 1.16 u[IU]/mL (ref 0.35–5.50)

## 2011-09-08 LAB — BASIC METABOLIC PANEL
CO2: 25 mEq/L (ref 19–32)
Calcium: 8.7 mg/dL (ref 8.4–10.5)
GFR: 94.81 mL/min (ref >60.00)
Potassium: 4 mEq/L (ref 3.5–5.1)
Sodium: 137 mEq/L (ref 135–145)

## 2011-09-08 LAB — CBC WITH DIFFERENTIAL/PLATELET
Basophils Absolute: 0.1 10*3/uL (ref 0.0–0.1)
HCT: 40.6 % (ref 36.0–46.0)
Lymphs Abs: 1.8 10*3/uL (ref 0.7–4.0)
Monocytes Relative: 5.2 % (ref 3.0–12.0)
Neutrophils Relative %: 74.2 % (ref 43.0–77.0)
Platelets: 290 10*3/uL (ref 150.0–400.0)
RDW: 13.7 % (ref 11.5–14.6)
WBC: 11.7 10*3/uL — ABNORMAL HIGH (ref 4.5–10.5)

## 2011-09-08 LAB — LIPID PANEL
Cholesterol: 155 mg/dL (ref 0–200)
HDL: 68.9 mg/dL (ref >39.00)
Triglycerides: 115 mg/dL (ref 0.0–149.0)

## 2011-09-08 MED ORDER — BENZONATATE 200 MG PO CAPS: 200.0000 mg | ORAL_CAPSULE | Freq: Three times a day (TID) | ORAL | Status: AC | PRN

## 2011-09-08 MED ORDER — RAMIPRIL 10 MG PO CAPS: 10.0000 mg | ORAL_CAPSULE | Freq: Every day | ORAL | Status: DC

## 2011-09-08 MED ORDER — AZITHROMYCIN 250 MG PO TABS: 250.0000 mg | ORAL_TABLET | Freq: Every day | ORAL | Status: AC

## 2011-09-08 MED ORDER — ALPRAZOLAM 0.5 MG PO TABS: 0.5000 mg | ORAL_TABLET | Freq: Two times a day (BID) | ORAL | Status: DC | PRN

## 2011-09-08 NOTE — Progress Notes (Signed)
  Subjective:    Patient ID: Natalie Ware, female    DOB: 09-13-1965, 45 y.o.   MRN: [id number]  HPI CPE- due for pap, mammo ([facility]).  UTD on colonoscopy.  Cough- sxs started yesterday.  No fevers.  + sick contacts.  + bilateral ear pain, PND, sore throat, dry hacking cough.  Denies sinus pain/pressure.  Taking OTC Mucinex.   Review of Systems Patient reports no hearing changes, adenopathy, fever, weight change,  persistant/recurrent hoarseness , swallowing issues, chest pain, palpitations, edema, hemoptysis, dyspnea (rest/exertional/paroxysmal nocturnal), gastrointestinal bleeding (melena, rectal bleeding), abdominal pain, significant heartburn, bowel changes, GU symptoms (dysuria, hematuria, incontinence), Gyn symptoms (abnormal  bleeding, pain),  syncope, focal weakness, memory loss, numbness & tingling, skin/hair/nail changes, abnormal bruising or bleeding, anxiety, or depression.   + visual changes    Objective:   Physical Exam  General Appearance:    Alert, cooperative, no distress, appears stated age  Head:    Normocephalic, without obvious abnormality, atraumatic  Eyes:    PERRL, conjunctiva/corneas clear, EOM's intact, fundi    benign, both eyes  Ears:    Normal TM's and external ear canals, both ears  Nose:   Nares normal, septum midline, mucosa normal, no drainage    or sinus tenderness  Throat:   Lips, mucosa, and tongue normal; teeth and gums normal  Neck:   Supple, symmetrical, trachea midline, no adenopathy;    Thyroid: no enlargement/tenderness/nodules  Back:     Symmetric, no curvature, ROM normal, no CVA tenderness  Lungs:     Clear to auscultation bilaterally, respirations unlabored, + hacking cough  Chest Wall:    No tenderness or deformity   Heart:    Regular rate and rhythm, S1 and S2 normal, no murmur, rub   or gallop  Breast Exam:    No tenderness, masses, or nipple abnormality  Abdomen:     Soft, non-tender, bowel sounds active all four quadrants,    no  masses, no organomegaly  Genitalia:    External genitalia normal, cervix normal in appearance, no CMT, uterus in normal size and position, adnexa w/out mass or tenderness, mucosa pink and moist, no lesions or discharge present  Rectal:    Normal external appearance  Extremities:   Extremities normal, atraumatic, no cyanosis or edema  Pulses:   2+ and symmetric all extremities  Skin:   Skin color, texture, turgor normal, no rashes or lesions  Lymph nodes:   Cervical, supraclavicular, and axillary nodes normal  Neurologic:   CNII-XII intact, normal strength, sensation and reflexes    throughout          Assessment & Plan:

## 2011-09-08 NOTE — Patient Instructions (Signed)
Follow up in 6 months to recheck cholesterol and BP We'll notify you of your lab results Start the Azithromycin for bronchitis Use the cough meds as needed Call with any questions or concerns Hang in there! Happy New Year!

## 2011-09-08 NOTE — Assessment & Plan Note (Signed)
Pt's PE WNL w/ exception of hacking cough.  Check labs.  Refer for mammo.  Pap collected.  Anticipatory guidance provided.

## 2011-09-08 NOTE — Assessment & Plan Note (Signed)
Pap collected. 

## 2011-09-08 NOTE — Assessment & Plan Note (Signed)
Pt's sxs and severe cough consistent w/ bronchitis.  Given medical comorbidities, will start abx.  Reviewed supportive care and red flags that should prompt return.  Pt expressed understanding and is in agreement w/ plan.

## 2011-09-15 ENCOUNTER — Encounter: Payer: Self-pay | Admitting: *Deleted

## 2011-10-13 ENCOUNTER — Telehealth: Payer: Self-pay | Admitting: *Deleted

## 2011-10-13 NOTE — Telephone Encounter (Signed)
Noted  

## 2011-10-13 NOTE — Telephone Encounter (Signed)
Called [facility] to advise that I had spoken with pt and her contact information is still the same however she is experiencing some difficulties at this present time and has been unable to call to schedule an appt however plans to call this week or early next week to schedule apt. [facility] rep Burnett Harry advised she would update the chart with this information, MD Tabori aware.

## 2011-10-13 NOTE — Telephone Encounter (Signed)
Called pt per received notice via [facility] stating office unable to get in touch with pt via phone to make her mammogram apt, spoke to pt and she stated that she has had a crazy January per she has developed a cataract on her eye in a short period of time and to inform MD Beverely Low that she has an apt with [facility] on Wednesday and once she gets her eye concern taken care of then she will call [facility] about her mammogram

## 2012-02-10 ENCOUNTER — Other Ambulatory Visit: Payer: Self-pay | Admitting: Family Medicine

## 2012-02-10 MED ORDER — ALPRAZOLAM 0.5 MG PO TABS: 0.5000 mg | ORAL_TABLET | Freq: Two times a day (BID) | ORAL | Status: DC | PRN

## 2012-02-10 NOTE — Telephone Encounter (Signed)
.  rx faxed to pharmacy, manually.  

## 2012-02-10 NOTE — Telephone Encounter (Signed)
Last OV and refill noted 09-08-11 #60 with 1 refill

## 2012-02-10 NOTE — Telephone Encounter (Signed)
Ok for #60, 1 refill 

## 2012-02-10 NOTE — Telephone Encounter (Signed)
refill Alprazolam 0.5mg  tablet  Take one tablet by mouth every 12 hours if needed for sleep Last ov 12.31.12, last wrt. 12.31.12 qty 60

## 2012-06-30 ENCOUNTER — Other Ambulatory Visit: Payer: Self-pay

## 2012-06-30 MED ORDER — ALPRAZOLAM 0.5 MG PO TABS: 0.5000 mg | ORAL_TABLET | Freq: Two times a day (BID) | ORAL | Status: DC | PRN

## 2012-06-30 NOTE — Telephone Encounter (Signed)
OV/Labs 09/08/11. Alprazolam 02/10/12 #60 x1.  Plz advise    MW

## 2012-10-25 ENCOUNTER — Telehealth: Payer: Self-pay | Admitting: Family Medicine

## 2012-10-25 NOTE — Telephone Encounter (Signed)
Called for refill of Alprazolam.  [facility] had no reply to refill request sent week of 10/18/12.  Informed last office visit was 09/08/11 so is overdue to annual visit. Willing to schedule appointment but needs refill of Alprazolam ASAP since she's been out of it for past 1-2 month. 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  Fasting blood sugar 117 10/25/12.  Declined triage.  Please call cellular [phone] to advise how she can get refill.  Advised staff will not see message until 01/23/13 for call back.

## 2012-10-25 NOTE — Telephone Encounter (Signed)
Refill: Alprazolam 0.5mg  tablet. Take 1 tablet by mouth every 12 hours if needed for sleep. Last fill 06-30-12

## 2012-10-26 ENCOUNTER — Other Ambulatory Visit: Payer: Self-pay | Admitting: *Deleted

## 2012-10-26 DIAGNOSIS — F411 Generalized anxiety disorder: Secondary | ICD-10-CM

## 2012-10-26 MED ORDER — ALPRAZOLAM 0.5 MG PO TABS: 0.5000 mg | ORAL_TABLET | Freq: Two times a day (BID) | ORAL | Status: DC | PRN

## 2012-10-26 NOTE — Telephone Encounter (Signed)
Last OV 09-08-11, last filled 06-30-12 #60 1

## 2012-10-26 NOTE — Telephone Encounter (Signed)
Script for Xanax printed and placed on ledge for MD signature in am

## 2012-10-26 NOTE — Telephone Encounter (Signed)
Ok for #60, 1 refill 

## 2013-01-11 ENCOUNTER — Encounter: Payer: Self-pay | Admitting: Lab

## 2013-01-12 ENCOUNTER — Ambulatory Visit (INDEPENDENT_AMBULATORY_CARE_PROVIDER_SITE_OTHER): Payer: BC Managed Care – PPO | Admitting: Family Medicine

## 2013-01-12 ENCOUNTER — Encounter: Payer: Self-pay | Admitting: Family Medicine

## 2013-01-12 VITALS — BP 130/70 | HR 96 | Temp 98.4°F | Ht 63.0 in | Wt 152.8 lb

## 2013-01-12 DIAGNOSIS — R6889 Other general symptoms and signs: Secondary | ICD-10-CM

## 2013-01-12 NOTE — Patient Instructions (Addendum)
I will notify you as soon as the labs are back We'll call you with your Rheumatology appt Call with any questions or concerns You are NOT crazy! Hang in there!!!

## 2013-01-12 NOTE — Progress Notes (Signed)
  Subjective:    Patient ID: Natalie Ware, female    DOB: 30-Dec-1965, 47 y.o.   MRN: [id number]  HPI Multiple concerns- 'i think i have this crazy sjogren's disease'.  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  Persistent rash on neck.  Arthritic changes of hands bilaterally.  L ankle swelling- went to UC had xrays and was told there was arthritis present.  + constipation, fatigue.  'random pains'.  + raynaud's.  + difficulty swalllowing.   Pt has looked up her sxs and feels this is consistent w/ sjogren's.   Review of Systems For ROS see HPI     Objective:   Physical Exam  Vitals reviewed. Constitutional: She is oriented to person, place, and time. She appears well-developed and well-nourished. No distress.  HENT:  Head: Normocephalic and atraumatic.  Eyes: Conjunctivae and EOM are normal. Pupils are equal, round, and reactive to light.  Neck: Normal range of motion. Neck supple. No thyromegaly present.  Cardiovascular: Normal rate, regular rhythm, normal heart sounds and intact distal pulses.   No murmur heard. Pulmonary/Chest: Effort normal and breath sounds normal. No respiratory distress.  Abdominal: Soft. She exhibits no distension. There is no tenderness.  Musculoskeletal: She exhibits no edema.  Lymphadenopathy:    She has no cervical adenopathy.  Neurological: She is alert and oriented to person, place, and time. No cranial nerve deficit. Coordination normal.  + raynaud's  Skin: Skin is warm and dry.  Psychiatric: She has a normal mood and affect. Her behavior is normal.          Assessment & Plan:

## 2013-01-12 NOTE — Assessment & Plan Note (Signed)
New.  Agree that pt's sxs sound rheumatologic in nature.  Will check labs and refer to rheum for complete evaluation and tx.  Reviewed supportive care and red flags that should prompt return.  Pt expressed understanding and is in agreement w/ plan.

## 2013-01-13 LAB — BASIC METABOLIC PANEL
BUN: 10 mg/dL (ref 6–23)
CO2: 27 mEq/L (ref 19–32)
Chloride: 101 mEq/L (ref 96–112)
Glucose, Bld: 174 mg/dL — ABNORMAL HIGH (ref 70–99)
Potassium: 4 mEq/L (ref 3.5–5.1)
Sodium: 136 mEq/L (ref 135–145)

## 2013-01-13 LAB — SJOGRENS SYNDROME-A EXTRACTABLE NUCLEAR ANTIBODY: SSA (Ro) (ENA) Antibody, IgG: 2 AU/mL (ref <30)

## 2013-01-13 LAB — CBC WITH DIFFERENTIAL/PLATELET
Basophils Absolute: 0.1 10*3/uL (ref 0.0–0.1)
Basophils Relative: 1.4 % (ref 0.0–3.0)
Eosinophils Absolute: 0.4 10*3/uL (ref 0.0–0.7)
Eosinophils Relative: 4 % (ref 0.0–5.0)
HCT: 42.8 % (ref 36.0–46.0)
Hemoglobin: 14.4 g/dL (ref 12.0–15.0)
Lymphocytes Relative: 20.6 % (ref 12.0–46.0)
Lymphs Abs: 2.1 10*3/uL (ref 0.7–4.0)
MCHC: 33.7 g/dL (ref 30.0–36.0)
MCV: 93.4 fl (ref 78.0–100.0)
Monocytes Absolute: 0.6 10*3/uL (ref 0.1–1.0)
Monocytes Relative: 5.8 % (ref 3.0–12.0)
Neutro Abs: 6.8 10*3/uL (ref 1.4–7.7)
Neutrophils Relative %: 68.2 % (ref 43.0–77.0)
Platelets: 290 10*3/uL (ref 150.0–400.0)
RBC: 4.58 Mil/uL (ref 3.87–5.11)
RDW: 13.4 % (ref 11.5–14.6)
WBC: 10 10*3/uL (ref 4.5–10.5)

## 2013-01-13 LAB — SJOGRENS SYNDROME-B EXTRACTABLE NUCLEAR ANTIBODY: SSB (La) (ENA) Antibody, IgG: <1 AU/mL (ref <30)

## 2013-01-18 ENCOUNTER — Encounter: Payer: Self-pay | Admitting: Family Medicine

## 2013-01-18 NOTE — Telephone Encounter (Signed)
Note sent to pt.//AB/CMA

## 2013-01-27 ENCOUNTER — Encounter: Payer: Self-pay | Admitting: Family Medicine

## 2013-06-20 ENCOUNTER — Other Ambulatory Visit: Payer: Self-pay | Admitting: Family Medicine

## 2013-06-20 NOTE — Telephone Encounter (Signed)
Last visit: 01/12/2013  Last filled: 10/26/2012, #60 1 refill  UDS-01/12/2013-low risk, contract signed  Please advise. SW

## 2013-07-14 ENCOUNTER — Other Ambulatory Visit: Payer: Self-pay

## 2013-10-06 LAB — HM DIABETES EYE EXAM

## 2013-10-18 ENCOUNTER — Encounter: Payer: Self-pay | Admitting: Family Medicine

## 2014-04-21 ENCOUNTER — Telehealth (INDEPENDENT_AMBULATORY_CARE_PROVIDER_SITE_OTHER): Payer: Self-pay | Admitting: Surgery

## 2014-04-21 NOTE — Telephone Encounter (Signed)
error 

## 2014-05-02 ENCOUNTER — Encounter: Payer: Self-pay | Admitting: Internal Medicine

## 2015-02-17 ENCOUNTER — Ambulatory Visit (INDEPENDENT_AMBULATORY_CARE_PROVIDER_SITE_OTHER): Payer: BLUE CROSS/BLUE SHIELD | Admitting: Family Medicine

## 2015-02-17 VITALS — BP 120/82 | HR 104 | Temp 98.2°F | Resp 17 | Ht 64.0 in | Wt 160.0 lb

## 2015-02-17 DIAGNOSIS — S81011A Laceration without foreign body, right knee, initial encounter: Secondary | ICD-10-CM | POA: Diagnosis not present

## 2015-02-17 DIAGNOSIS — Z23 Encounter for immunization: Secondary | ICD-10-CM

## 2015-02-17 DIAGNOSIS — IMO0002 Reserved for concepts with insufficient information to code with codable children: Secondary | ICD-10-CM

## 2015-02-17 DIAGNOSIS — M25561 Pain in right knee: Secondary | ICD-10-CM

## 2015-02-17 DIAGNOSIS — T148 Other injury of unspecified body region: Secondary | ICD-10-CM

## 2015-02-17 NOTE — Progress Notes (Addendum)
° °  Subjective:    Patient ID: Natalie Ware, female    DOB: 02-Jun-1966, 49 y.o.   MRN: [id number] This chart was scribed for Robyn Haber, MD by Zola Button, Medical Scribe. This patient was seen in Room 10 and the patient's care was started at 4:28 PM.   HPI HPI Comments: Natalie Ware is a 49 y.o. female with a hx of Type I DM who presents to the [facility] complaining of a laceration to her right shin secondary to a fall last night during the middle of the night in which she hit her leg on a fan. She also hit her right shoulder and her knees, but she does not have any wounds to those areas. She has associated pain to the area. She did apply peroxide on the wound. Patient denies any other injuries.  Patient works at [employer].  Review of Systems  Skin: Positive for wound.       Objective:   Physical Exam CONSTITUTIONAL: Well developed/well nourished HEAD: Normocephalic/atraumatic EYES: EOM/PERRL ENMT: Mucous membranes moist NECK: supple no meningeal signs SPINE: entire spine nontender ABDOMEN: soft, nontender, no rebound or guarding GU: no cva tenderness NEURO: Pt is awake/alert, moves all extremitiesx4 EXTREMITIES: pulses normal, full ROM SKIN: warm, color normal. 4 cm superficial laceration to the right shin.  The area was cleansed with soap and water and then secured with Dermabond and a dry sterile dressing. PSYCH: no abnormalities of mood noted     Assessment & Plan:   This chart was scribed in my presence and reviewed by me personally.    ICD-9-CM ICD-10-CM   1. Pain in joint, lower leg, right 719.46 M25.561   2. Laceration 879.8 T14.8 Tdap vaccine greater than or equal to 7yo IM   Patient may resume normal ADLs and continue to observe for signs of infection.  Signed, Robyn Haber, MD

## 2015-02-17 NOTE — Patient Instructions (Signed)

## 2015-05-09 ENCOUNTER — Ambulatory Visit (INDEPENDENT_AMBULATORY_CARE_PROVIDER_SITE_OTHER): Payer: BLUE CROSS/BLUE SHIELD | Admitting: Family Medicine

## 2015-05-09 VITALS — BP 124/80 | HR 84 | Temp 98.2°F | Resp 16 | Ht 64.0 in | Wt 165.0 lb

## 2015-05-09 DIAGNOSIS — J351 Hypertrophy of tonsils: Secondary | ICD-10-CM | POA: Diagnosis not present

## 2015-05-09 DIAGNOSIS — R070 Pain in throat: Secondary | ICD-10-CM | POA: Diagnosis not present

## 2015-05-09 DIAGNOSIS — J029 Acute pharyngitis, unspecified: Secondary | ICD-10-CM

## 2015-05-09 LAB — POCT RAPID STREP A (OFFICE): Rapid Strep A Screen: NEGATIVE

## 2015-05-09 NOTE — Progress Notes (Signed)
Subjective:    Patient ID: Natalie Ware, female    DOB: 1966/08/02, 49 y.o.   MRN: [id number]  HPI This is pleasant 49 yo female who presents today with 2 weeks plus of sore throat, sinus drainage, cough. Symptoms other than sore throat resolved, but sore throat continues. Today left side is worse. Some improvement with tylenol. No improvement with gargling with warm salt water and vinegar with no improvement. She has not had any difficulty with breathing or swallowing, but does have pain with eating/drinking and feels swollen.   Blood sugars running slightly higher than normal 180-200s. HgbA1C a little higher last visit. Sees Dr. Elyse Hsu manages her diabetes. Sees Dr. Bennye Alm for primary care.   Past Medical History  Diagnosis Date  . Insulin dependent diabetes mellitus     insulin pump  . Hypertension   . Constipation   . Dyspnea   . TIA (transient ischemic attack)   . Anxiety   . Chronic headache   . Seizure    Past Surgical History  Procedure Laterality Date  . Hernia repair    . Foreign body removal      right index finger   Family History  Problem Relation Age of Onset  . Colon cancer Maternal Grandfather   . Cancer Maternal Grandfather     colon  . Diabetes Mother   . Ovarian cancer Paternal Aunt   . Cancer Paternal Aunt     ovarian   Social History  Substance Use Topics  . Smoking status: Former Smoker    Quit date: 09/08/2008  . Smokeless tobacco: None  . Alcohol Use: Yes     Comment: social  Medications, allergies, past medical history, surgical history, family history, social history and problem list reviewed and updated.  Review of Systems No fever, occasional non-productive cough, minimal PND, no headache, no rhinorrhea, myalgias resolved. No ear pain/pressure.     Objective:   Physical Exam Physical Exam  Constitutional: Oriented to person, place, and time. She appears well-developed and well-nourished.  HENT:  Head: Normocephalic and  atraumatic.  Eyes: Conjunctivae are normal.  Ears: normal canals and TMs Nose: no rhinorrhea, edema or erythema Throat: Bilateral tonsils +2, no erythema, no exudate.  Neck: Normal range of motion. Neck supple.  Cardiovascular: Normal rate, regular rhythm and normal heart sounds.   Pulmonary/Chest: Effort normal and breath sounds normal.  Musculoskeletal: Normal range of motion.  Neurological: Alert and oriented to person, place, and time.  Skin: Skin is warm and dry.  Psychiatric: Normal mood and affect. Behavior is normal. Judgment and thought content normal.  Vitals reviewed. BP 124/80 mmHg  Pulse 84  Temp(Src) 98.2 F (36.8 C) (Oral)  Resp 16  Ht 5\' 4"  (1.626 m)  Wt 165 lb (74.844 kg)  BMI 28.31 kg/m2  SpO2 98%  LMP 04/30/2015 Wt Readings from Last 3 Encounters:  05/09/15 165 lb (74.844 kg)  02/17/15 160 lb (72.576 kg)  01/12/13 152 lb 12.8 oz (69.31 kg)   Results for orders placed or performed in visit on 05/09/15  POCT rapid strep A  Result Value Ref Range   Rapid Strep A Screen Negative Negative      Assessment & Plan:  1. Acute pharyngitis, unspecified pharyngitis type - POCT rapid strep A, culture if negative - Ambulatory referral to ENT - given length of symptom without obvious cause, will go ahead and refer to ENT  2. Swelling of tonsil - Ambulatory referral to ENT  3. Pain in  throat - Ambulatory referral to ENT - suggested she try ibuprofen 400- 600 mg po BID to TID for pain - RTC if worsening pain, fever, any difficulty swallowing or breathing.   Clarene Reamer, FNP-BC  [facility]  05/09/2015 8:52 AM

## 2015-05-09 NOTE — Patient Instructions (Signed)
We will call you with results of throat culture and appointment to ENT Pharyngitis Pharyngitis is redness, pain, and swelling (inflammation) of your pharynx.  CAUSES  Pharyngitis is usually caused by infection. Most of the time, these infections are from viruses (viral) and are part of a cold. However, sometimes pharyngitis is caused by bacteria (bacterial). Pharyngitis can also be caused by allergies. Viral pharyngitis may be spread from person to person by coughing, sneezing, and personal items or utensils (cups, forks, spoons, toothbrushes). Bacterial pharyngitis may be spread from person to person by more intimate contact, such as kissing.  SIGNS AND SYMPTOMS  Symptoms of pharyngitis include:   Sore throat.   Tiredness (fatigue).   Low-grade fever.   Headache.  Joint pain and muscle aches.  Skin rashes.  Swollen lymph nodes.  Plaque-like film on throat or tonsils (often seen with bacterial pharyngitis). DIAGNOSIS  Your health care provider will ask you questions about your illness and your symptoms. Your medical history, along with a physical exam, is often all that is needed to diagnose pharyngitis. Sometimes, a rapid strep test is done. Other lab tests may also be done, depending on the suspected cause.  TREATMENT  Viral pharyngitis will usually get better in 3-4 days without the use of medicine. Bacterial pharyngitis is treated with medicines that kill germs (antibiotics).  HOME CARE INSTRUCTIONS   Drink enough water and fluids to keep your urine clear or pale yellow.   Only take over-the-counter or prescription medicines as directed by your health care provider:   If you are prescribed antibiotics, make sure you finish them even if you start to feel better.   Do not take aspirin.   Get lots of rest.   Gargle with 8 oz of salt water ( tsp of salt per 1 qt of water) as often as every 1-2 hours to soothe your throat.   Throat lozenges (if you are not at risk  for choking) or sprays may be used to soothe your throat. SEEK MEDICAL CARE IF:   You have large, tender lumps in your neck.  You have a rash.  You cough up green, yellow-brown, or bloody spit. SEEK IMMEDIATE MEDICAL CARE IF:   Your neck becomes stiff.  You drool or are unable to swallow liquids.  You vomit or are unable to keep medicines or liquids down.  You have severe pain that does not go away with the use of recommended medicines.  You have trouble breathing (not caused by a stuffy nose). MAKE SURE YOU:   Understand these instructions.  Will watch your condition.  Will get help right away if you are not doing well or get worse. Document Released: 08/25/2005 Document Revised: 06/15/2013 Document Reviewed: 05/02/2013 [facility] Patient Information 2015 [place], [place]. This information is not intended to replace advice given to you by your health care provider. Make sure you discuss any questions you have with your health care provider.

## 2015-05-10 LAB — CULTURE, GROUP A STREP: Organism ID, Bacteria: NORMAL

## 2015-06-06 ENCOUNTER — Ambulatory Visit (INDEPENDENT_AMBULATORY_CARE_PROVIDER_SITE_OTHER): Payer: BLUE CROSS/BLUE SHIELD | Admitting: Family Medicine

## 2015-06-06 ENCOUNTER — Encounter: Payer: Self-pay | Admitting: Family Medicine

## 2015-06-06 VITALS — BP 147/93 | HR 88 | Temp 98.8°F | Resp 16 | Ht 63.0 in | Wt 161.2 lb

## 2015-06-06 DIAGNOSIS — R21 Rash and other nonspecific skin eruption: Secondary | ICD-10-CM | POA: Diagnosis not present

## 2015-06-06 DIAGNOSIS — L659 Nonscarring hair loss, unspecified: Secondary | ICD-10-CM

## 2015-06-06 DIAGNOSIS — Z23 Encounter for immunization: Secondary | ICD-10-CM

## 2015-06-06 MED ORDER — HYDROXYZINE HCL 25 MG PO TABS
12.5000 mg | ORAL_TABLET | Freq: Three times a day (TID) | ORAL | Status: DC | PRN
Start: 2015-06-06 — End: 2016-01-10

## 2015-06-06 NOTE — Progress Notes (Signed)
Subjective:    Patient ID: Natalie Ware, female    DOB: June 02, 1966, 49 y.o.   MRN: [id number]  HPI Patient has noticed that her hair is thinning/falling out for last several months. Worse at temples. Has been using Rogaine for about a month, has not noticed a difference with treatment. No itching of scalp or bald spots.   She has been under more stress lately with work and personal life. Not sleeping well, tossing and turning having intermittent hives and itching on arms and abdomen. None on back or legs. She was given some xanax for anxiety but hasn't been taking.   She has an appointment with her endocrinologist today. She had labs drawn prior to visit and will have faxed to me.   Was seen by ENT for sore throat, completed extensive antibiotic therapy and had improvement in symptoms.   Past Medical History  Diagnosis Date  . Insulin dependent diabetes mellitus     insulin pump  . Hypertension   . Constipation   . Dyspnea   . TIA (transient ischemic attack)   . Anxiety   . Chronic headache   . Seizure    Past Surgical History  Procedure Laterality Date  . Hernia repair    . Foreign body removal      right index finger   Social History  Substance Use Topics  . Smoking status: Former Smoker    Quit date: 09/08/2008  . Smokeless tobacco: None  . Alcohol Use: Yes     Comment: social     Review of Systems Per HPI    Objective:   Physical Exam  Constitutional: She is oriented to person, place, and time. She appears well-developed and well-nourished.  HENT:  Head: Normocephalic and atraumatic.  Hair is very fine, evenly distributed throughout scalp, no bare spots, no lesions or discoloration of scalp.   Eyes: Conjunctivae are normal.  Neck: Normal range of motion.  Cardiovascular: Normal rate and regular rhythm.   Pulmonary/Chest: Effort normal.  Musculoskeletal: Normal range of motion.  Neurological: She is alert and oriented to person, place, and time.  Skin:  Skin is warm and dry. No rash noted. No erythema. No pallor.  Psychiatric: She has a normal mood and affect. Her behavior is normal. Judgment and thought content normal.  Vitals reviewed.  BP 147/93 mmHg  Pulse 88  Temp(Src) 98.8 F (37.1 C) (Oral)  Resp 16  Ht 5\' 3"  (1.6 m)  Wt 161 lb 3.2 oz (73.12 kg)  BMI 28.56 kg/m2  LMP 05/30/2015 Wt Readings from Last 3 Encounters:  06/06/15 161 lb 3.2 oz (73.12 kg)  05/09/15 165 lb (74.844 kg)  02/17/15 160 lb (72.576 kg)   Depression screen [facility] 2/9 06/06/2015 05/09/2015 02/17/2015  Decreased Interest 0 0 0  Down, Depressed, Hopeless 0 0 0  PHQ - 2 Score 0 0 0      Assessment & Plan:  1. Need for prophylactic vaccination and inoculation against influenza - Flu Vaccine QUAD 36+ mos IM  2. Hair loss - continue Rogaine - will review labs from endocrine- she has an appointment with them today and I suggested she mention to them  3. Rash and nonspecific skin eruption - suspect this is stress reaction, discussed stress relieving techniques and encouraged regular exercise, regular healthy meals, good sleep hygien.  - hydrOXYzine (ATARAX/VISTARIL) 25 MG tablet; Take 0.5-1 tablets (12.5-25 mg total) by mouth every 8 (eight) hours as needed for anxiety or itching.  Dispense: 30  tablet; Refill: 1  - if no improvement in 2 weeks, will refer to derm  Clarene Reamer, FNP-BC  [facility]  06/08/2015 8:15 AM

## 2015-06-06 NOTE — Patient Instructions (Signed)
Hives Hives are itchy, red, swollen areas of the skin. They can vary in size and location on your body. Hives can come and go for hours or several days (acute hives) or for several weeks (chronic hives). Hives do not spread from person to person (noncontagious). They may get worse with scratching, exercise, and emotional stress. CAUSES   Allergic reaction to food, additives, or drugs.  Infections, including the common cold.  Illness, such as vasculitis, lupus, or thyroid disease.  Exposure to sunlight, heat, or cold.  Exercise.  Stress.  Contact with chemicals. SYMPTOMS   Red or white swollen patches on the skin. The patches may change size, shape, and location quickly and repeatedly.  Itching.  Swelling of the hands, feet, and face. This may occur if hives develop deeper in the skin. DIAGNOSIS  Your caregiver can usually tell what is wrong by performing a physical exam. Skin or blood tests may also be done to determine the cause of your hives. In some cases, the cause cannot be determined. TREATMENT  Mild cases usually get better with medicines such as antihistamines. Severe cases may require an emergency epinephrine injection. If the cause of your hives is known, treatment includes avoiding that trigger.  HOME CARE INSTRUCTIONS   Avoid causes that trigger your hives.  Take antihistamines as directed by your caregiver to reduce the severity of your hives. Non-sedating or low-sedating antihistamines are usually recommended. Do not drive while taking an antihistamine.  Take any other medicines prescribed for itching as directed by your caregiver.  Wear loose-fitting clothing.  Keep all follow-up appointments as directed by your caregiver. SEEK MEDICAL CARE IF:   You have persistent or severe itching that is not relieved with medicine.  You have painful or swollen joints. SEEK IMMEDIATE MEDICAL CARE IF:   You have a fever.  Your tongue or lips are swollen.  You have  trouble breathing or swallowing.  You feel tightness in the throat or chest.  You have abdominal pain. These problems may be the first sign of a life-threatening allergic reaction. Call your local emergency services (911 in [place] MAKE SURE YOU:   Understand these instructions.  Will watch your condition.  Will get help right away if you are not doing well or get worse. Document Released: 08/25/2005 Document Revised: 08/30/2013 Document Reviewed: 11/18/2011 [employer] Patient Information 2015 [employer]. This information is not intended to replace advice given to you by your health care provider. Make sure you discuss any questions you have with your health care provider.  

## 2015-06-28 ENCOUNTER — Emergency Department (HOSPITAL_COMMUNITY)
Admission: EM | Admit: 2015-06-28 | Discharge: 2015-06-28 | Disposition: A | Discharge status: Home / Self Care | Payer: BLUE CROSS/BLUE SHIELD | Attending: Emergency Medicine | Admitting: Emergency Medicine

## 2015-06-28 ENCOUNTER — Encounter (HOSPITAL_COMMUNITY): Payer: Self-pay | Admitting: Neurology

## 2015-06-28 ENCOUNTER — Emergency Department (HOSPITAL_COMMUNITY): Payer: BLUE CROSS/BLUE SHIELD

## 2015-06-28 DIAGNOSIS — F419 Anxiety disorder, unspecified: Secondary | ICD-10-CM | POA: Diagnosis not present

## 2015-06-28 DIAGNOSIS — Z7982 Long term (current) use of aspirin: Secondary | ICD-10-CM | POA: Diagnosis not present

## 2015-06-28 DIAGNOSIS — I10 Essential (primary) hypertension: Secondary | ICD-10-CM | POA: Diagnosis not present

## 2015-06-28 DIAGNOSIS — E119 Type 2 diabetes mellitus without complications: Secondary | ICD-10-CM | POA: Insufficient documentation

## 2015-06-28 DIAGNOSIS — Z794 Long term (current) use of insulin: Secondary | ICD-10-CM | POA: Insufficient documentation

## 2015-06-28 DIAGNOSIS — Z8673 Personal history of transient ischemic attack (TIA), and cerebral infarction without residual deficits: Secondary | ICD-10-CM | POA: Diagnosis not present

## 2015-06-28 DIAGNOSIS — Z79899 Other long term (current) drug therapy: Secondary | ICD-10-CM | POA: Diagnosis not present

## 2015-06-28 DIAGNOSIS — K439 Ventral hernia without obstruction or gangrene: Secondary | ICD-10-CM | POA: Diagnosis not present

## 2015-06-28 DIAGNOSIS — Z87891 Personal history of nicotine dependence: Secondary | ICD-10-CM | POA: Diagnosis not present

## 2015-06-28 DIAGNOSIS — Z3202 Encounter for pregnancy test, result negative: Secondary | ICD-10-CM | POA: Diagnosis not present

## 2015-06-28 DIAGNOSIS — R1033 Periumbilical pain: Secondary | ICD-10-CM | POA: Diagnosis present

## 2015-06-28 LAB — POC URINE PREG, ED: Preg Test, Ur: NEGATIVE

## 2015-06-28 LAB — CBC WITH DIFFERENTIAL/PLATELET
BASOS ABS: 0 10*3/uL (ref 0.0–0.1)
BASOS PCT: 0 %
EOS ABS: 0.3 10*3/uL (ref 0.0–0.7)
EOS PCT: 3 %
HEMATOCRIT: 41.7 % (ref 36.0–46.0)
Hemoglobin: 14.4 g/dL (ref 12.0–15.0)
Lymphocytes Relative: 20 %
Lymphs Abs: 2 10*3/uL (ref 0.7–4.0)
MCH: 30.6 pg (ref 26.0–34.0)
MCHC: 34.5 g/dL (ref 30.0–36.0)
MCV: 88.5 fL (ref 78.0–100.0)
MONO ABS: 0.7 10*3/uL (ref 0.1–1.0)
MONOS PCT: 7 %
Neutro Abs: 6.9 10*3/uL (ref 1.7–7.7)
Neutrophils Relative %: 70 %
PLATELETS: 278 10*3/uL (ref 150–400)
RBC: 4.71 MIL/uL (ref 3.87–5.11)
RDW: 12.6 % (ref 11.5–15.5)
WBC: 9.9 10*3/uL (ref 4.0–10.5)

## 2015-06-28 LAB — COMPREHENSIVE METABOLIC PANEL
ALBUMIN: 4.1 g/dL (ref 3.5–5.0)
ALK PHOS: 63 U/L (ref 38–126)
ALT: 43 U/L (ref 14–54)
AST: 34 U/L (ref 15–41)
Anion gap: 12 (ref 5–15)
BILIRUBIN TOTAL: 0.8 mg/dL (ref 0.3–1.2)
BUN: 16 mg/dL (ref 6–20)
CALCIUM: 9.6 mg/dL (ref 8.9–10.3)
CO2: 24 mmol/L (ref 22–32)
CREATININE: 1.07 mg/dL — AB (ref 0.44–1.00)
Chloride: 95 mmol/L — ABNORMAL LOW (ref 101–111)
GFR calc Af Amer: >60 mL/min (ref >60)
GFR calc non Af Amer: >60 mL/min (ref >60)
GLUCOSE: 364 mg/dL — AB (ref 65–99)
Potassium: 3.7 mmol/L (ref 3.5–5.1)
SODIUM: 131 mmol/L — AB (ref 135–145)
TOTAL PROTEIN: 7.5 g/dL (ref 6.5–8.1)

## 2015-06-28 LAB — URINE MICROSCOPIC-ADD ON

## 2015-06-28 LAB — URINALYSIS, ROUTINE W REFLEX MICROSCOPIC
BILIRUBIN URINE: NEGATIVE
HGB URINE DIPSTICK: NEGATIVE
Ketones, ur: NEGATIVE mg/dL
Leukocytes, UA: NEGATIVE
Nitrite: NEGATIVE
PROTEIN: NEGATIVE mg/dL
Specific Gravity, Urine: 1.018 (ref 1.005–1.030)
Urobilinogen, UA: 0.2 mg/dL (ref 0.0–1.0)
pH: 8 (ref 5.0–8.0)

## 2015-06-28 LAB — LIPASE, BLOOD: LIPASE: 30 U/L (ref 11–51)

## 2015-06-28 MED ORDER — IOHEXOL 300 MG/ML  SOLN: 25.0000 mL | Freq: Once | INTRAMUSCULAR | Status: DC | PRN

## 2015-06-28 MED ORDER — ONDANSETRON HCL 4 MG/2ML IJ SOLN
4.0000 mg | Freq: Once | INTRAMUSCULAR | Status: AC
  Administered 2015-06-28: 4 mg via INTRAVENOUS
  Filled 2015-06-28: qty 2

## 2015-06-28 MED ORDER — IOHEXOL 300 MG/ML  SOLN
100.0000 mL | Freq: Once | INTRAMUSCULAR | Status: AC | PRN
  Administered 2015-06-28: 100 mL via INTRAVENOUS

## 2015-06-28 MED ORDER — SODIUM CHLORIDE 0.9 % IV BOLUS (SEPSIS)
1000.0000 mL | Freq: Once | INTRAVENOUS | Status: AC
  Administered 2015-06-28: 1000 mL via INTRAVENOUS

## 2015-06-28 MED ORDER — HYDROMORPHONE HCL 1 MG/ML IJ SOLN
1.0000 mg | Freq: Once | INTRAMUSCULAR | Status: AC
  Administered 2015-06-28: 1 mg via INTRAVENOUS
  Filled 2015-06-28: qty 1

## 2015-06-28 NOTE — ED Provider Notes (Signed)
CSN: [id number]     Arrival date & time 06/28/15  1621 History   First MD Initiated Contact with Patient 06/28/15 1659     Chief Complaint  Patient presents with  . Abdominal Pain     (Consider location/radiation/quality/duration/timing/severity/associated sxs/prior Treatment) HPI   Natalie Ware is a 49 y.o. female who presents for evaluation of acute onset of periumbilical pain, several hours ago. The pain waxes and wanes, but is severe, most of the time. The pain is in an area where she has had previous hernia repair. She has chronic ongoing constipation. She denies fever, chills, cough, chest pain, weakness or dizziness. There's been no nausea, vomiting. There are no other known modifying factors.   Past Medical History  Diagnosis Date  . Insulin dependent diabetes mellitus (HCC)     insulin pump  . Hypertension   . Constipation   . Dyspnea   . TIA (transient ischemic attack)   . Anxiety   . Chronic headache   . Seizure [facility])    Past Surgical History  Procedure Laterality Date  . Hernia repair    . Foreign body removal      right index finger   Family History  Problem Relation Age of Onset  . Colon cancer Maternal Grandfather   . Cancer Maternal Grandfather     colon  . Diabetes Mother   . Ovarian cancer Paternal Aunt   . Cancer Paternal Aunt     ovarian   Social History  Substance Use Topics  . Smoking status: Former Smoker    Quit date: 09/08/2008  . Smokeless tobacco: None  . Alcohol Use: Yes     Comment: social   OB History    No data available     Review of Systems  All other systems reviewed and are negative.     Allergies  Insulin glargine; Lubiprostone; and Metoclopramide hcl  Home Medications   Prior to Admission medications   Medication Sig Start Date End Date Taking? Authorizing Provider  acetaminophen (TYLENOL) 500 MG tablet Take 1,000 mg by mouth every 6 (six) hours as needed for mild pain.   Yes Historical Provider, MD  aspirin 81  MG tablet Take 162 mg by mouth daily.    Yes Historical Provider, MD  hydrOXYzine (ATARAX/VISTARIL) 25 MG tablet Take 0.5-1 tablets (12.5-25 mg total) by mouth every 8 (eight) hours as needed for anxiety or itching. 06/06/15  Yes Elby Beck, FNP  Multiple Vitamin (MULTIVITAMIN) tablet Take 1 tablet by mouth daily.     Yes Historical Provider, MD  NOVOLOG 100 UNIT/ML injection Inject into skin as directed VIA Pump 06/20/15  Yes Historical Provider, MD  ramipril (ALTACE) 10 MG capsule Take 1 capsule (10 mg total) by mouth daily. 09/08/11  Yes Midge Minium, MD   BP 122/70 mmHg  Pulse 84  Temp(Src) 98.1 F (36.7 C) (Oral)  Resp 22  SpO2 96%  LMP 05/30/2015 Physical Exam  Constitutional: She is oriented to person, place, and time. She appears well-developed and well-nourished.  HENT:  Head: Normocephalic and atraumatic.  Right Ear: External ear normal.  Left Ear: External ear normal.  Eyes: Conjunctivae and EOM are normal. Pupils are equal, round, and reactive to light.  Neck: Normal range of motion and phonation normal. Neck supple.  Cardiovascular: Normal rate, regular rhythm and normal heart sounds.   Pulmonary/Chest: Effort normal and breath sounds normal. She exhibits no bony tenderness.  Abdominal: Soft. There is tenderness (Mild periumbilical tenderness  without palpable mass or deformity.).  Bowel sounds are hypoactive in all quadrants.  Musculoskeletal: Normal range of motion.  Neurological: She is alert and oriented to person, place, and time. No cranial nerve deficit or sensory deficit. She exhibits normal muscle tone. Coordination normal.  Skin: Skin is warm, dry and intact.  Psychiatric: She has a normal mood and affect. Her behavior is normal. Judgment and thought content normal.  Nursing note and vitals reviewed.   ED Course  Procedures (including critical care time)  Medications  iohexol (OMNIPAQUE) 300 MG/ML solution 25 mL (not administered)  sodium  chloride 0.9 % bolus 1,000 mL (1,000 mLs Intravenous New Bag/Given 06/28/15 1740)  HYDROmorphone (DILAUDID) injection 1 mg (1 mg Intravenous Given 06/28/15 1736)  ondansetron (ZOFRAN) injection 4 mg (4 mg Intravenous Given 06/28/15 1736)  iohexol (OMNIPAQUE) 300 MG/ML solution 100 mL (100 mLs Intravenous Contrast Given 06/28/15 1916)    Patient Vitals for the past 24 hrs:  BP Temp Temp src Pulse Resp SpO2  06/28/15 1900 122/70 mmHg - - 84 - 96 %  06/28/15 1845 139/72 mmHg - - 85 - 96 %  06/28/15 1830 121/94 mmHg - - 94 - 92 %  06/28/15 1815 129/73 mmHg - - 81 - 95 %  06/28/15 1800 106/86 mmHg - - 88 - 97 %  06/28/15 1640 (!) 170/112 mmHg 98.1 F (36.7 C) Oral 106 22 99 %   20:15- case. Findings discussed with general surgery, Dr. Grandville Silos, who states that he can follow-up with the patient in the office, for elective repair of abdominal wall hernia  8:20 PM Reevaluation with update and discussion. After initial assessment and treatment, an updated evaluation reveals she is comfortable now. No abdominal pain at this time. Findings discussed with the patient, all questions were answered. Natalie Ware L    Labs Review Labs Reviewed  COMPREHENSIVE METABOLIC PANEL - Abnormal; Notable for the following:    Sodium 131 (*)    Chloride 95 (*)    Glucose, Bld 364 (*)    Creatinine, Ser 1.07 (*)    All other components within normal limits  URINALYSIS, ROUTINE W REFLEX MICROSCOPIC (NOT AT [facility]) - Abnormal; Notable for the following:    Glucose, UA >1000 (*)    All other components within normal limits  CBC WITH DIFFERENTIAL/PLATELET  LIPASE, BLOOD  URINE MICROSCOPIC-ADD ON  POC URINE PREG, ED    Imaging Review Ct Abdomen Pelvis W Contrast  06/28/2015  CLINICAL DATA:  Localized abdominal pain near the umbilicus. EXAM: CT ABDOMEN AND PELVIS WITH CONTRAST TECHNIQUE: Multidetector CT imaging of the abdomen and pelvis was performed using the standard protocol following bolus administration  of intravenous contrast. CONTRAST:  170mL OMNIPAQUE IOHEXOL 300 MG/ML  SOLN COMPARISON:  03/19/2010 FINDINGS: Lower chest: The lung bases are clear of acute process. No pleural effusion or pulmonary lesions. The heart is normal in size no pericardial effusion. The distal esophagus is grossly normal. There is a small hiatal hernia. Hepatobiliary: No focal hepatic lesions or intrahepatic biliary dilatation. Area of focal fatty change noted near the left portal vein. The gallbladder is normal. No common bile duct dilatation. Pancreas: No mass, inflammation or ductal dilatation Spleen: Normal size.  No focal lesions. Adrenals/Urinary Tract: The adrenal glands kidneys are normal and stable. Both kidneys are normal except for a simple lower pole right renal cysts. No obstructing ureteral calculi or bladder calculi. Stomach/Bowel: The stomach, duodenum, small bowel and colon are unremarkable. No inflammatory changes, mass lesions or obstructive  findings. The terminal ileum is normal. The appendix is normal. Vascular/Lymphatic: No mesenteric or retroperitoneal mass or adenopathy. Small scattered lymph nodes are noted. The aorta is normal in caliber. The branch vessels are normal. The major venous structures are patent. Reproductive: The uterus and ovaries are normal. Small follicles/cysts noted bilaterally. Other: No pelvic mass or adenopathy. No significant free pelvic fluid collections. No inguinal mass or adenopathy. There is a periumbilical abdominal wall hernia containing indurated fat. This could be a small incarcerated hernia. No bowel involvement. Musculoskeletal: No significant bony findings. IMPRESSION: 1. Small periumbilical abdominal wall hernia containing indurated fat suggesting a small incarcerated hernia. No bowel involvement. 2. No significant intra abdominal/pelvic findings, mass lesions or adenopathy. Electronically Signed   By: Marijo Sanes M.D.   On: 06/28/2015 19:30   I have personally reviewed and  evaluated these images and lab results as part of my medical decision-making.   EKG Interpretation None      MDM   Final diagnoses:  Ventral hernia without obstruction or gangrene     Abdominal pain with abdominal wall hernia, without associated contents of bowel. No evidence for acute infectious process or bowel obstruction.  Nursing Notes Reviewed/ Care Coordinated Applicable Imaging Reviewed Interpretation of Laboratory Data incorporated into ED treatment  The patient appears reasonably screened and/or stabilized for discharge and I doubt any other medical condition or other [facility] requiring further screening, evaluation, or treatment in the ED at this time prior to discharge.  Plan: Home Medications- stool softener; Home Treatments- rest; return here if the recommended treatment, does not improve the symptoms; Recommended follow up- Gen. Surg. asap    Daleen Bo, MD 06/28/15 2031

## 2015-06-28 NOTE — ED Notes (Signed)
Pt with pain to umbilicus, has hx of hernia. Swelling and hardness to umbilicus, pt restful, very uncomfortable.

## 2015-06-28 NOTE — Discharge Instructions (Signed)
Hernia, Adult A hernia is the bulging of an organ or tissue through a weak spot in the muscles of the abdomen (abdominal wall). Hernias develop most often near the navel or groin. There are many kinds of hernias. Common kinds include:  Femoral hernia. This kind of hernia develops under the groin in the upper thigh area.  Inguinal hernia. This kind of hernia develops in the groin or scrotum.  Umbilical hernia. This kind of hernia develops near the navel.  Hiatal hernia. This kind of hernia causes part of the stomach to be pushed up into the chest.  Incisional hernia. This kind of hernia bulges through a scar from an abdominal surgery. CAUSES This condition may be caused by:  Heavy lifting.  Coughing over a long period of time.  Straining to have a bowel movement.  An incision made during an abdominal surgery.  A birth defect (congenital defect).  Excess weight or obesity.  Smoking.  Poor nutrition.  Cystic fibrosis.  Excess fluid in the abdomen.  Undescended testicles. SYMPTOMS Symptoms of a hernia include:  A lump on the abdomen. This is the first sign of a hernia. The lump may become more obvious with standing, straining, or coughing. It may get bigger over time if it is not treated or if the condition causing it is not treated.  Pain. A hernia is usually painless, but it may become painful over time if treatment is delayed. The pain is usually dull and may get worse with standing or lifting heavy objects. Sometimes a hernia gets tightly squeezed in the weak spot (strangulated) or stuck there (incarcerated) and causes additional symptoms. These symptoms may include:  Vomiting.  Nausea.  Constipation.  Irritability. DIAGNOSIS A hernia may be diagnosed with:  A physical exam. During the exam your health care provider may ask you to cough or to make a specific movement, because a hernia is usually more visible when you move.  Imaging tests. These can  include:  X-rays.  Ultrasound.  CT scan. TREATMENT A hernia that is small and painless may not need to be treated. A hernia that is large or painful may be treated with surgery. Inguinal hernias may be treated with surgery to prevent incarceration or strangulation. Strangulated hernias are always treated with surgery, because lack of blood to the trapped organ or tissue can cause it to die. Surgery to treat a hernia involves pushing the bulge back into place and repairing the weak part of the abdomen. HOME CARE INSTRUCTIONS  Avoid straining.  Do not lift anything heavier than 10 lb (4.5 kg).  Lift with your leg muscles, not your back muscles. This helps avoid strain.  When coughing, try to cough gently.  Prevent constipation. Constipation leads to straining with bowel movements, which can make a hernia worse or cause a hernia repair to break down. You can prevent constipation by:  Eating a high-fiber diet that includes plenty of fruits and vegetables.  Drinking enough fluids to keep your urine clear or pale yellow. Aim to drink 6-8 glasses of water per day.  Using a stool softener as directed by your health care provider.  Lose weight, if you are overweight.  Do not use any tobacco products, including cigarettes, chewing tobacco, or electronic cigarettes. If you need help quitting, ask your health care provider.  Keep all follow-up visits as directed by your health care provider. This is important. Your health care provider may need to monitor your condition. SEEK MEDICAL CARE IF:  You have   swelling, redness, and pain in the affected area.  Your bowel habits change. SEEK IMMEDIATE MEDICAL CARE IF:  You have a fever.  You have abdominal pain that is getting worse.  You feel nauseous or you vomit.  You cannot push the hernia back in place by gently pressing on it while you are lying down.  The hernia:  Changes in shape or size.  Is stuck outside the  abdomen.  Becomes discolored.  Feels hard or tender.   This information is not intended to replace advice given to you by your health care provider. Make sure you discuss any questions you have with your health care provider.   Document Released: 08/25/2005 Document Revised: 09/15/2014 Document Reviewed: 07/05/2014 Elsevier Interactive Patient Education 2016 [employer]  

## 2015-07-24 DIAGNOSIS — R0989 Other specified symptoms and signs involving the circulatory and respiratory systems: Secondary | ICD-10-CM

## 2015-07-24 NOTE — Progress Notes (Signed)
No Show

## 2015-07-25 ENCOUNTER — Encounter: Payer: BLUE CROSS/BLUE SHIELD | Admitting: Cardiovascular Disease

## 2015-07-27 ENCOUNTER — Encounter: Payer: Self-pay | Admitting: Cardiovascular Disease

## 2015-09-09 HISTORY — PX: NECK SURGERY: SHX720

## 2015-10-05 ENCOUNTER — Telehealth: Payer: Self-pay | Admitting: Behavioral Health

## 2015-10-05 NOTE — Telephone Encounter (Signed)
Assessed the following gaps in care: A1c (> 8%) Pap  Unable to reach patient at this time. Left a message regarding the above gaps.

## 2015-11-27 ENCOUNTER — Other Ambulatory Visit: Payer: Self-pay

## 2015-12-11 DIAGNOSIS — R079 Chest pain, unspecified: Secondary | ICD-10-CM | POA: Diagnosis not present

## 2015-12-11 DIAGNOSIS — E1065 Type 1 diabetes mellitus with hyperglycemia: Secondary | ICD-10-CM | POA: Diagnosis not present

## 2015-12-11 DIAGNOSIS — R51 Headache: Secondary | ICD-10-CM | POA: Diagnosis not present

## 2015-12-11 DIAGNOSIS — Z6826 Body mass index (BMI) 26.0-26.9, adult: Secondary | ICD-10-CM | POA: Diagnosis not present

## 2016-01-10 ENCOUNTER — Encounter: Payer: Self-pay | Admitting: Cardiovascular Disease

## 2016-01-10 ENCOUNTER — Ambulatory Visit (INDEPENDENT_AMBULATORY_CARE_PROVIDER_SITE_OTHER): Payer: BLUE CROSS/BLUE SHIELD | Admitting: Cardiovascular Disease

## 2016-01-10 VITALS — BP 116/68 | HR 75 | Ht 62.0 in | Wt 164.0 lb

## 2016-01-10 DIAGNOSIS — Z7189 Other specified counseling: Secondary | ICD-10-CM | POA: Diagnosis not present

## 2016-01-10 DIAGNOSIS — R51 Headache: Secondary | ICD-10-CM

## 2016-01-10 DIAGNOSIS — Q211 Atrial septal defect: Secondary | ICD-10-CM

## 2016-01-10 DIAGNOSIS — Q2112 Patent foramen ovale: Secondary | ICD-10-CM

## 2016-01-10 DIAGNOSIS — Z7689 Persons encountering health services in other specified circumstances: Secondary | ICD-10-CM

## 2016-01-10 DIAGNOSIS — R519 Headache, unspecified: Secondary | ICD-10-CM

## 2016-01-10 NOTE — Progress Notes (Signed)
Patient ID: Natalie Ware, female   DOB: 1966/01/08, 50 y.o.   MRN: [id number]     Cardiology Office Note   Date:  01/10/2016   ID:  Natalie Ware, DOB Jan 04, 1966, MRN [id number]  PCP:  Rachell Cipro, MD  Cardiologist:   Jenkins Rouge, MD   Chief Complaint  Patient presents with  . PFO  . Establish Care      History of Present Illness: Natalie Ware is a 50 y.o. female who presents for f/u PFO.  Last seen by [place] in 2010.   She was seen by Dr.Sethi and Dr. Einar Gip initially  In November 2009  she had a TIA. She had some mental confusion and some slurred speech. MRI and CT showed no CVA. She had a patent foramen ovale by bubble study and transesophageal echo. Reviewed her echo and TEE. This is the only episode she's ever had. No history of head trauma or seizures. No previous history of cardiac disease. She has not had any recurrences. She is allergic to nickel and did not want to be randomized for closure device. Think she had some valid points. In general I told her we usually don't recommend closure unless there was a second episode of unexplained neurological events. We can follow her foramen noninvasively with transthoracic echo and bubble study. She is also a long-standing diabetic with an insulin pump. She smoked off and on for 15 years. He has exertional dyspnea. I told her I didn't know if this was an anginal equivalent but given her smoking of long-standing insulin-dependent diabetes at that she had  a stress Myoview which was normal . Also reviewed her CTA. Is a question of a 50% right carotid stenosis but followup duplex showed no physiologic evidence of a carotid lesion. She should likely have a followup duplex . She works at [employer]. He is fairly sedentary except for being on her feet for 8 hours a day. Does not get chest pain palpitations PND orthopnea there is no lower extremity edema. Is not a recent hypoglycemic event.  Has new glucose monitor and pump Has been  having headaches a lot Not migraines Gets toradol shots Dr Ernie Hew wanted to make sure her carotids ok    Past Medical History  Diagnosis Date  . Insulin dependent diabetes mellitus (HCC)     insulin pump  . Hypertension   . Constipation   . Dyspnea   . TIA (transient ischemic attack)   . Anxiety   . Chronic headache   . Seizure [facility])     Past Surgical History  Procedure Laterality Date  . Hernia repair    . Foreign body removal      right index finger     Current Outpatient Prescriptions  Medication Sig Dispense Refill  . acetaminophen (TYLENOL) 500 MG tablet Take 1,000 mg by mouth every 6 (six) hours as needed for mild pain.    Marland Kitchen ALPRAZolam (XANAX) 0.5 MG tablet Take 0.5 mg by mouth daily as needed. For anxiety  0  . aspirin 81 MG tablet Take 162 mg by mouth daily.     Marland Kitchen escitalopram (LEXAPRO) 20 MG tablet Take 20 mg by mouth daily.  0  . Multiple Vitamin (MULTIVITAMIN) tablet Take 1 tablet by mouth daily.      Marland Kitchen NOVOLOG 100 UNIT/ML injection Inject into skin as directed VIA Pump  1  . ramipril (ALTACE) 2.5 MG capsule Take 2.5 mg by mouth 2 (two) times daily.     No  current facility-administered medications for this visit.    Allergies:   Insulin glargine; Lubiprostone; and Metoclopramide hcl    Social History:  The patient  reports that she quit smoking about 7 years ago. She does not have any smokeless tobacco history on file. She reports that she drinks alcohol. She reports that she does not use illicit drugs.   Family History:  The patient's family history includes Cancer in her maternal grandfather and paternal aunt; Colon cancer in her maternal grandfather; Diabetes in her mother; Ovarian cancer in her paternal aunt.    ROS:  Please see the history of present illness.   Otherwise, review of systems are positive for none.   All other systems are reviewed and negative.    PHYSICAL EXAM: VS:  BP 116/68 mmHg  Pulse 75  Ht 5\' 2"  (1.575 m)  Wt 74.39 kg (164 lb)   BMI 29.99 kg/m2  SpO2 97% , BMI Body mass index is 29.99 kg/(m^2). Affect appropriate Healthy:  appears stated age 21: normal Neck supple with no adenopathy JVP normal no bruits no thyromegaly Lungs clear with no wheezing and good diaphragmatic motion Heart:  S1/S2 no murmur, no rub, gallop or click PMI normal Abdomen: benighn, BS positve, no tenderness, no AAA no bruit.  No HSM or HJR Distal pulses intact with no bruits No edema Neuro non-focal Skin warm and dry No muscular weakness    EKG:  2012 NSR rate 78 normal ECG  01/10/16  SR rate 81 normal    Recent Labs: 06/28/2015: ALT 43; BUN 16; Creatinine, Ser 1.07*; Hemoglobin 14.4; Platelets 278; Potassium 3.7; Sodium 131*    Lipid Panel    Component Value Date/Time   CHOL 155 09/08/2011 0844   TRIG 115.0 09/08/2011 0844   HDL 68.90 09/08/2011 0844   CHOLHDL 2 09/08/2011 0844   VLDL 23.0 09/08/2011 0844   LDLCALC 63 09/08/2011 0844      Wt Readings from Last 3 Encounters:  01/10/16 74.39 kg (164 lb)  06/06/15 73.12 kg (161 lb 3.2 oz)  05/09/15 74.844 kg (165 lb)      Other studies Reviewed: Additional studies/ records that were reviewed today include: Records 2010, Echo with bubble 2010 Dr [facility] office notes .    ASSESSMENT AND PLAN:  1. PFO no TIA/Stroke symptoms no need for f/u bubble study or echo  2. DM:  On pump A1 c with primary f/u ETT to r/o CAD On ACE 3. Previous Smoker lungs clear no active wheezing  4. HTN improved with bid altace 5. Headache: more likely tension headaches f/u carotid duplex at request primary no burits   Current medicines are reviewed at length with the patient today.  The patient does not have concerns regarding medicines.  The following changes have been made:  no change  Labs/ tests ordered today include: ET and Carotid Duplex   No orders of the defined types were placed in this encounter.     Disposition:   FU with me in a year      Signed, Jenkins Rouge, MD    01/10/2016 11:45 AM    [facility] [address], [place], [place]  [address] Phone: [phone]; Fax: [phone]

## 2016-01-10 NOTE — Patient Instructions (Signed)
Medication Instructions:  Your physician recommends that you continue on your current medications as directed. Please refer to the Current Medication list given to you today.  Labwork: NONE  Testing/Procedures: Your physician has requested that you have an exercise tolerance test. For further information please visit [email or web address]. Please also follow instruction sheet, as given.  Your physician has requested that you have a carotid duplex. This test is an ultrasound of the carotid arteries in your neck. It looks at blood flow through these arteries that supply the brain with blood. Allow one hour for this exam. There are no restrictions or special instructions.  Follow-Up: Your physician wants you to follow-up in: 6 months with Dr. Johnsie Cancel. You will receive a reminder letter in the mail two months in advance. If you don't receive a letter, please call our office to schedule the follow-up appointment.   If you need a refill on your cardiac medications before your next appointment, please call your pharmacy.

## 2016-01-16 ENCOUNTER — Telehealth (HOSPITAL_COMMUNITY): Payer: Self-pay

## 2016-01-16 ENCOUNTER — Encounter: Payer: Self-pay | Admitting: Cardiology

## 2016-01-16 ENCOUNTER — Encounter: Payer: Self-pay | Admitting: Cardiovascular Disease

## 2016-01-16 NOTE — Telephone Encounter (Signed)
Encounter complete. 

## 2016-01-18 ENCOUNTER — Ambulatory Visit (HOSPITAL_COMMUNITY)
Admission: RE | Admit: 2016-01-18 | Discharge: 2016-01-18 | Disposition: A | Discharge status: Home / Self Care | Payer: BLUE CROSS/BLUE SHIELD | Source: Ambulatory Visit | Attending: Internal Medicine | Admitting: Internal Medicine

## 2016-01-18 DIAGNOSIS — R51 Headache: Secondary | ICD-10-CM | POA: Diagnosis not present

## 2016-01-18 DIAGNOSIS — Z8673 Personal history of transient ischemic attack (TIA), and cerebral infarction without residual deficits: Secondary | ICD-10-CM | POA: Diagnosis not present

## 2016-01-18 DIAGNOSIS — Z7189 Other specified counseling: Secondary | ICD-10-CM | POA: Insufficient documentation

## 2016-01-18 DIAGNOSIS — Z87891 Personal history of nicotine dependence: Secondary | ICD-10-CM | POA: Diagnosis not present

## 2016-01-18 DIAGNOSIS — Q2112 Patent foramen ovale: Secondary | ICD-10-CM

## 2016-01-18 DIAGNOSIS — I10 Essential (primary) hypertension: Secondary | ICD-10-CM | POA: Diagnosis not present

## 2016-01-18 DIAGNOSIS — R519 Headache, unspecified: Secondary | ICD-10-CM

## 2016-01-18 DIAGNOSIS — E119 Type 2 diabetes mellitus without complications: Secondary | ICD-10-CM | POA: Insufficient documentation

## 2016-01-18 DIAGNOSIS — Q211 Atrial septal defect: Secondary | ICD-10-CM

## 2016-01-18 DIAGNOSIS — Z7689 Persons encountering health services in other specified circumstances: Secondary | ICD-10-CM

## 2016-01-21 ENCOUNTER — Telehealth: Payer: Self-pay | Admitting: Cardiovascular Disease

## 2016-01-21 NOTE — Telephone Encounter (Signed)
Patient aware of test results.

## 2016-01-21 NOTE — Telephone Encounter (Signed)
New message ° ° ° ° ° °Returning a call to the nurse to get test results °

## 2016-01-22 LAB — EXERCISE TOLERANCE TEST
CSEPED: 8 min
CSEPHR: 87 %
Estimated workload: 10.1 METS
MPHR: 172 {beats}/min
Peak HR: 151 {beats}/min
RPE: 17
Rest HR: 71 {beats}/min

## 2016-01-25 DIAGNOSIS — E109 Type 1 diabetes mellitus without complications: Secondary | ICD-10-CM | POA: Diagnosis not present

## 2016-01-25 DIAGNOSIS — E1065 Type 1 diabetes mellitus with hyperglycemia: Secondary | ICD-10-CM | POA: Diagnosis not present

## 2016-01-28 DIAGNOSIS — Z23 Encounter for immunization: Secondary | ICD-10-CM | POA: Diagnosis not present

## 2016-02-06 DIAGNOSIS — E1165 Type 2 diabetes mellitus with hyperglycemia: Secondary | ICD-10-CM | POA: Diagnosis not present

## 2016-02-06 DIAGNOSIS — L03116 Cellulitis of left lower limb: Secondary | ICD-10-CM | POA: Diagnosis not present

## 2016-02-06 DIAGNOSIS — S79922A Unspecified injury of left thigh, initial encounter: Secondary | ICD-10-CM | POA: Diagnosis not present

## 2016-02-06 DIAGNOSIS — S7012XA Contusion of left thigh, initial encounter: Secondary | ICD-10-CM | POA: Diagnosis not present

## 2016-02-06 DIAGNOSIS — S71112A Laceration without foreign body, left thigh, initial encounter: Secondary | ICD-10-CM | POA: Diagnosis not present

## 2016-02-06 DIAGNOSIS — S80812A Abrasion, left lower leg, initial encounter: Secondary | ICD-10-CM | POA: Diagnosis not present

## 2016-02-14 DIAGNOSIS — E1065 Type 1 diabetes mellitus with hyperglycemia: Secondary | ICD-10-CM | POA: Diagnosis not present

## 2016-02-14 DIAGNOSIS — S79922A Unspecified injury of left thigh, initial encounter: Secondary | ICD-10-CM | POA: Diagnosis not present

## 2016-02-14 DIAGNOSIS — E109 Type 1 diabetes mellitus without complications: Secondary | ICD-10-CM | POA: Diagnosis not present

## 2016-02-25 DIAGNOSIS — S79929D Unspecified injury of unspecified thigh, subsequent encounter: Secondary | ICD-10-CM | POA: Diagnosis not present

## 2016-02-25 DIAGNOSIS — F411 Generalized anxiety disorder: Secondary | ICD-10-CM | POA: Diagnosis not present

## 2016-02-25 DIAGNOSIS — F331 Major depressive disorder, recurrent, moderate: Secondary | ICD-10-CM | POA: Diagnosis not present

## 2016-02-25 DIAGNOSIS — I10 Essential (primary) hypertension: Secondary | ICD-10-CM | POA: Diagnosis not present

## 2016-03-03 DIAGNOSIS — E1065 Type 1 diabetes mellitus with hyperglycemia: Secondary | ICD-10-CM | POA: Diagnosis not present

## 2016-03-03 DIAGNOSIS — E109 Type 1 diabetes mellitus without complications: Secondary | ICD-10-CM | POA: Diagnosis not present

## 2016-04-10 DIAGNOSIS — E109 Type 1 diabetes mellitus without complications: Secondary | ICD-10-CM | POA: Diagnosis not present

## 2016-04-10 DIAGNOSIS — E1065 Type 1 diabetes mellitus with hyperglycemia: Secondary | ICD-10-CM | POA: Diagnosis not present

## 2016-04-25 DIAGNOSIS — E1065 Type 1 diabetes mellitus with hyperglycemia: Secondary | ICD-10-CM | POA: Diagnosis not present

## 2016-04-28 DIAGNOSIS — M940 Chondrocostal junction syndrome [Tietze]: Secondary | ICD-10-CM | POA: Diagnosis not present

## 2016-05-05 DIAGNOSIS — B029 Zoster without complications: Secondary | ICD-10-CM | POA: Diagnosis not present

## 2016-05-06 DIAGNOSIS — Z9641 Presence of insulin pump (external) (internal): Secondary | ICD-10-CM | POA: Diagnosis not present

## 2016-05-06 DIAGNOSIS — R5382 Chronic fatigue, unspecified: Secondary | ICD-10-CM | POA: Diagnosis not present

## 2016-05-06 DIAGNOSIS — E103513 Type 1 diabetes mellitus with proliferative diabetic retinopathy with macular edema, bilateral: Secondary | ICD-10-CM | POA: Diagnosis not present

## 2016-05-06 DIAGNOSIS — E1065 Type 1 diabetes mellitus with hyperglycemia: Secondary | ICD-10-CM | POA: Diagnosis not present

## 2016-05-07 DIAGNOSIS — E109 Type 1 diabetes mellitus without complications: Secondary | ICD-10-CM | POA: Diagnosis not present

## 2016-05-08 DIAGNOSIS — H04123 Dry eye syndrome of bilateral lacrimal glands: Secondary | ICD-10-CM | POA: Diagnosis not present

## 2016-05-08 DIAGNOSIS — E1065 Type 1 diabetes mellitus with hyperglycemia: Secondary | ICD-10-CM | POA: Diagnosis not present

## 2016-05-08 DIAGNOSIS — H35372 Puckering of macula, left eye: Secondary | ICD-10-CM | POA: Diagnosis not present

## 2016-05-08 DIAGNOSIS — H2513 Age-related nuclear cataract, bilateral: Secondary | ICD-10-CM | POA: Diagnosis not present

## 2016-05-08 DIAGNOSIS — E103513 Type 1 diabetes mellitus with proliferative diabetic retinopathy with macular edema, bilateral: Secondary | ICD-10-CM | POA: Diagnosis not present

## 2016-05-14 DIAGNOSIS — E109 Type 1 diabetes mellitus without complications: Secondary | ICD-10-CM | POA: Diagnosis not present

## 2016-05-14 DIAGNOSIS — E1065 Type 1 diabetes mellitus with hyperglycemia: Secondary | ICD-10-CM | POA: Diagnosis not present

## 2016-06-03 DIAGNOSIS — E1065 Type 1 diabetes mellitus with hyperglycemia: Secondary | ICD-10-CM | POA: Diagnosis not present

## 2016-06-03 DIAGNOSIS — E109 Type 1 diabetes mellitus without complications: Secondary | ICD-10-CM | POA: Diagnosis not present

## 2016-06-04 DIAGNOSIS — I10 Essential (primary) hypertension: Secondary | ICD-10-CM | POA: Diagnosis not present

## 2016-06-04 DIAGNOSIS — E109 Type 1 diabetes mellitus without complications: Secondary | ICD-10-CM | POA: Diagnosis not present

## 2016-06-04 DIAGNOSIS — Z23 Encounter for immunization: Secondary | ICD-10-CM | POA: Diagnosis not present

## 2016-06-04 DIAGNOSIS — F411 Generalized anxiety disorder: Secondary | ICD-10-CM | POA: Diagnosis not present

## 2016-06-04 DIAGNOSIS — G4700 Insomnia, unspecified: Secondary | ICD-10-CM | POA: Diagnosis not present

## 2016-06-18 DIAGNOSIS — T07XXXA Unspecified multiple injuries, initial encounter: Secondary | ICD-10-CM | POA: Diagnosis not present

## 2016-06-27 DIAGNOSIS — E1065 Type 1 diabetes mellitus with hyperglycemia: Secondary | ICD-10-CM | POA: Diagnosis not present

## 2016-07-24 DIAGNOSIS — E1065 Type 1 diabetes mellitus with hyperglycemia: Secondary | ICD-10-CM | POA: Diagnosis not present

## 2016-07-24 DIAGNOSIS — E109 Type 1 diabetes mellitus without complications: Secondary | ICD-10-CM | POA: Diagnosis not present

## 2016-08-18 ENCOUNTER — Ambulatory Visit (INDEPENDENT_AMBULATORY_CARE_PROVIDER_SITE_OTHER): Payer: BLUE CROSS/BLUE SHIELD

## 2016-08-18 ENCOUNTER — Ambulatory Visit (INDEPENDENT_AMBULATORY_CARE_PROVIDER_SITE_OTHER): Payer: BLUE CROSS/BLUE SHIELD | Admitting: Physician Assistant

## 2016-08-18 VITALS — BP 144/86 | HR 94 | Temp 98.2°F | Resp 17 | Ht 62.0 in | Wt 180.0 lb

## 2016-08-18 DIAGNOSIS — M79671 Pain in right foot: Secondary | ICD-10-CM

## 2016-08-18 DIAGNOSIS — M7989 Other specified soft tissue disorders: Secondary | ICD-10-CM | POA: Diagnosis not present

## 2016-08-18 DIAGNOSIS — S99921A Unspecified injury of right foot, initial encounter: Secondary | ICD-10-CM | POA: Diagnosis not present

## 2016-08-18 NOTE — Progress Notes (Signed)
Patient ID: Natalie Ware, female    DOB: 1965/09/20, 50 y.o.   MRN: [id number]  PCP: Rachell Cipro, MD  Chief Complaint  Patient presents with  . Foot Injury    Right foot, got slammed in car door 1 day before thanksgiving.     Subjective:   Presents for evaluation of RIGHT foot pain x 3 weeks.  Car door slammed on her RIGHT foot on 07/30/2016. Able to weight bear, but walks on the inner aspect of the foot. Any shoes that touch the outer aspect of the foot increases the pain.  Employed in retail, stands all day. No swelling. No wound.     Review of Systems As above.    Patient Active Problem List   Diagnosis Date Noted  . Multiple somatic complaints 01/12/2013  . Bronchitis 09/08/2011  . Screening for malignant neoplasm of the cervix 09/08/2011  . Routine gynecological examination 09/08/2011  . Chest pain 02/17/2011  . Back pain 02/17/2011  . Nausea 02/17/2011  . DIABETES MELLITUS, TYPE I 10/22/2010  . ANXIETY STATE, UNSPECIFIED 10/22/2010  . PATENT FORAMEN OVALE 10/22/2010  . GALACTORRHEA NOT ASSOCIATED WITH CHILDBIRTH 04/25/2009  . CONSTIPATION 04/03/2009  . DYSPNEA 04/03/2009  . DM 03/30/2009  . SMOKER 03/30/2009  . HYPERTENSION 03/30/2009  . TIA 03/30/2009     Prior to Admission medications   Medication Sig Start Date End Date Taking? Authorizing Provider  acetaminophen (TYLENOL) 500 MG tablet Take 1,000 mg by mouth every 6 (six) hours as needed for mild pain.   Yes Historical Provider, MD  ALPRAZolam Duanne Moron) 0.5 MG tablet Take 0.5 mg by mouth daily as needed. For anxiety 12/26/15  Yes Historical Provider, MD  aspirin 81 MG tablet Take 162 mg by mouth daily.    Yes Historical Provider, MD  escitalopram (LEXAPRO) 20 MG tablet Take 20 mg by mouth daily. 11/23/15  Yes Historical Provider, MD  Multiple Vitamin (MULTIVITAMIN) tablet Take 1 tablet by mouth daily.     Yes Historical Provider, MD  NOVOLOG 100 UNIT/ML injection Inject into skin as directed VIA  Pump 06/20/15  Yes Historical Provider, MD  ramipril (ALTACE) 2.5 MG capsule Take 2.5 mg by mouth 2 (two) times daily.   Yes Historical Provider, MD     Allergies  Allergen Reactions  . Insulin Glargine     Rash   . Lubiprostone     Built up water in RIght eye and could not see  . Metoclopramide Hcl     lactating       Objective:  Physical Exam  Constitutional: She is oriented to person, place, and time. She appears well-developed and well-nourished. She is active and cooperative. No distress.  BP (!) 144/86 (BP Location: Right Arm, Patient Position: Sitting, Cuff Size: Normal)   Pulse 94   Temp 98.2 F (36.8 C) (Oral)   Resp 17   Ht 5\' 2"  (1.575 m)   Wt 180 lb (81.6 kg)   SpO2 99%   BMI 32.92 kg/m    Eyes: Conjunctivae are normal.  Pulmonary/Chest: Effort normal.  Musculoskeletal:       Right ankle: Normal. Achilles tendon normal.       Right foot: There is tenderness and bony tenderness. There is normal range of motion, no swelling, normal capillary refill, no crepitus, no deformity and no laceration.       Feet:  Neurological: She is alert and oriented to person, place, and time.  Psychiatric: She has a normal mood and affect. Her  speech is normal and behavior is normal.       Dg Foot Complete Right  Result Date: 08/18/2016 CLINICAL DATA:  Slammed in car door pain and swelling EXAM: RIGHT FOOT COMPLETE - 3+ VIEW COMPARISON:  None. FINDINGS: No subluxation or radiopaque foreign body is seen. Multiple sesamoid bones adjacent to the head of first metatarsal, on lateral view, there is irregular appearance of the margins of the sesamoid bone. IMPRESSION: 1. No subluxation seen. 2. Mild irregular appearance of sesamoid bones adjacent to the head of first metatarsal, could relate to anatomical variant although sesamoid bone fracture is also a consideration. Correlate clinically for point tenderness. Electronically Signed   By: Donavan Foil M.D.   On: 08/18/2016 15:40        Assessment & Plan:   1. Foot pain, right No fracture of the area of injury. Sesamoid irregularity is likely incidental, as there is no pain there. Question irregularity at the base of the 3rd and 4th metatarsals-will contact radiology for review. CAM walker. NSAIDS. Re-evaluate in 2 weeks if pain persists. - DG Foot Complete Right; Future   Fara Chute, PA-C Physician Assistant-Certified [facility]

## 2016-08-18 NOTE — Patient Instructions (Signed)
     IF you received an x-ray today, you will receive an invoice from [facility]. Please contact [facility] at [phone] with questions or concerns regarding your invoice.   IF you received labwork today, you will receive an invoice from [facility]. Please contact [facility] at [phone] with questions or concerns regarding your invoice.   Our billing staff will not be able to assist you with questions regarding bills from these companies.  You will be contacted with the lab results as soon as they are available. The fastest way to get your results is to activate your My Chart account. Instructions are located on the last page of this paperwork. If you have not heard from us regarding the results in 2 weeks, please contact this office.      

## 2016-08-19 ENCOUNTER — Telehealth: Payer: Self-pay | Admitting: Physician Assistant

## 2016-08-19 NOTE — Telephone Encounter (Signed)
Spoke with radiologist regarding abnormality seen at base of 3rd and 4th metatarsals, where patient has pain. Cortical thickening can represent stress fracture, though history does not correlate. Nutrient vessels seen in both locations as well. No fracture.

## 2016-10-24 DIAGNOSIS — M542 Cervicalgia: Secondary | ICD-10-CM | POA: Diagnosis not present

## 2016-10-24 DIAGNOSIS — M5412 Radiculopathy, cervical region: Secondary | ICD-10-CM | POA: Diagnosis not present

## 2016-10-28 DIAGNOSIS — M542 Cervicalgia: Secondary | ICD-10-CM | POA: Diagnosis not present

## 2016-10-28 DIAGNOSIS — M5412 Radiculopathy, cervical region: Secondary | ICD-10-CM | POA: Diagnosis not present

## 2016-10-29 DIAGNOSIS — M5412 Radiculopathy, cervical region: Secondary | ICD-10-CM | POA: Diagnosis not present

## 2016-10-29 DIAGNOSIS — M542 Cervicalgia: Secondary | ICD-10-CM | POA: Diagnosis not present

## 2016-11-01 DIAGNOSIS — M542 Cervicalgia: Secondary | ICD-10-CM | POA: Diagnosis not present

## 2016-11-03 DIAGNOSIS — M5412 Radiculopathy, cervical region: Secondary | ICD-10-CM | POA: Diagnosis not present

## 2016-11-03 DIAGNOSIS — M542 Cervicalgia: Secondary | ICD-10-CM | POA: Diagnosis not present

## 2016-11-06 DIAGNOSIS — H04123 Dry eye syndrome of bilateral lacrimal glands: Secondary | ICD-10-CM | POA: Diagnosis not present

## 2016-11-06 DIAGNOSIS — H25813 Combined forms of age-related cataract, bilateral: Secondary | ICD-10-CM | POA: Diagnosis not present

## 2016-11-06 DIAGNOSIS — E103513 Type 1 diabetes mellitus with proliferative diabetic retinopathy with macular edema, bilateral: Secondary | ICD-10-CM | POA: Diagnosis not present

## 2016-11-06 DIAGNOSIS — H35372 Puckering of macula, left eye: Secondary | ICD-10-CM | POA: Diagnosis not present

## 2016-11-10 DIAGNOSIS — M542 Cervicalgia: Secondary | ICD-10-CM | POA: Diagnosis not present

## 2016-11-10 DIAGNOSIS — E1065 Type 1 diabetes mellitus with hyperglycemia: Secondary | ICD-10-CM | POA: Diagnosis not present

## 2016-11-10 DIAGNOSIS — M5412 Radiculopathy, cervical region: Secondary | ICD-10-CM | POA: Diagnosis not present

## 2016-11-10 DIAGNOSIS — E109 Type 1 diabetes mellitus without complications: Secondary | ICD-10-CM | POA: Diagnosis not present

## 2016-11-11 DIAGNOSIS — E109 Type 1 diabetes mellitus without complications: Secondary | ICD-10-CM | POA: Diagnosis not present

## 2016-11-11 DIAGNOSIS — E1065 Type 1 diabetes mellitus with hyperglycemia: Secondary | ICD-10-CM | POA: Diagnosis not present

## 2016-11-13 ENCOUNTER — Telehealth: Payer: Self-pay | Admitting: Cardiovascular Disease

## 2016-11-13 NOTE — Telephone Encounter (Signed)
New message     Request for surgical clearance:  What type of surgery is being performed?  Cervical decompression fusion When is this surgery scheduled? 11-26-18 Are there any medications that need to be held prior to surgery and how long? Cardiac clearance Name of physician performing surgery?  Dr Lynann Bologna 1. What is your office phone and fax number?  Fax [phone]--------clearance form has also been faxed 2.

## 2016-11-16 NOTE — Telephone Encounter (Signed)
Ok to have surgery

## 2016-11-18 NOTE — Telephone Encounter (Signed)
Will fax clearance response.

## 2016-11-19 DIAGNOSIS — M5412 Radiculopathy, cervical region: Secondary | ICD-10-CM | POA: Diagnosis not present

## 2016-11-25 DIAGNOSIS — M5412 Radiculopathy, cervical region: Secondary | ICD-10-CM | POA: Diagnosis not present

## 2016-12-03 DIAGNOSIS — Z981 Arthrodesis status: Secondary | ICD-10-CM | POA: Diagnosis not present

## 2016-12-16 DIAGNOSIS — I10 Essential (primary) hypertension: Secondary | ICD-10-CM | POA: Diagnosis not present

## 2016-12-16 DIAGNOSIS — Z6830 Body mass index (BMI) 30.0-30.9, adult: Secondary | ICD-10-CM | POA: Diagnosis not present

## 2016-12-16 DIAGNOSIS — J329 Chronic sinusitis, unspecified: Secondary | ICD-10-CM | POA: Diagnosis not present

## 2016-12-16 DIAGNOSIS — F411 Generalized anxiety disorder: Secondary | ICD-10-CM | POA: Diagnosis not present

## 2016-12-17 DIAGNOSIS — M5412 Radiculopathy, cervical region: Secondary | ICD-10-CM | POA: Diagnosis not present

## 2016-12-17 DIAGNOSIS — Z9889 Other specified postprocedural states: Secondary | ICD-10-CM | POA: Diagnosis not present

## 2016-12-22 DIAGNOSIS — E109 Type 1 diabetes mellitus without complications: Secondary | ICD-10-CM | POA: Diagnosis not present

## 2016-12-22 DIAGNOSIS — E1065 Type 1 diabetes mellitus with hyperglycemia: Secondary | ICD-10-CM | POA: Diagnosis not present

## 2017-01-05 DIAGNOSIS — J309 Allergic rhinitis, unspecified: Secondary | ICD-10-CM | POA: Diagnosis not present

## 2017-01-05 DIAGNOSIS — Z6830 Body mass index (BMI) 30.0-30.9, adult: Secondary | ICD-10-CM | POA: Diagnosis not present

## 2017-01-05 DIAGNOSIS — I10 Essential (primary) hypertension: Secondary | ICD-10-CM | POA: Diagnosis not present

## 2017-01-05 DIAGNOSIS — R05 Cough: Secondary | ICD-10-CM | POA: Diagnosis not present

## 2017-01-13 DIAGNOSIS — M5412 Radiculopathy, cervical region: Secondary | ICD-10-CM | POA: Diagnosis not present

## 2017-01-16 DIAGNOSIS — M542 Cervicalgia: Secondary | ICD-10-CM | POA: Diagnosis not present

## 2017-01-16 DIAGNOSIS — M4322 Fusion of spine, cervical region: Secondary | ICD-10-CM | POA: Diagnosis not present

## 2017-01-20 DIAGNOSIS — M4322 Fusion of spine, cervical region: Secondary | ICD-10-CM | POA: Diagnosis not present

## 2017-01-20 DIAGNOSIS — M542 Cervicalgia: Secondary | ICD-10-CM | POA: Diagnosis not present

## 2017-01-22 DIAGNOSIS — M542 Cervicalgia: Secondary | ICD-10-CM | POA: Diagnosis not present

## 2017-01-22 DIAGNOSIS — M4322 Fusion of spine, cervical region: Secondary | ICD-10-CM | POA: Diagnosis not present

## 2017-01-23 DIAGNOSIS — Z6830 Body mass index (BMI) 30.0-30.9, adult: Secondary | ICD-10-CM | POA: Diagnosis not present

## 2017-01-23 DIAGNOSIS — E109 Type 1 diabetes mellitus without complications: Secondary | ICD-10-CM | POA: Diagnosis not present

## 2017-01-23 DIAGNOSIS — I10 Essential (primary) hypertension: Secondary | ICD-10-CM | POA: Diagnosis not present

## 2017-01-23 DIAGNOSIS — E785 Hyperlipidemia, unspecified: Secondary | ICD-10-CM | POA: Diagnosis not present

## 2017-01-26 DIAGNOSIS — M542 Cervicalgia: Secondary | ICD-10-CM | POA: Diagnosis not present

## 2017-01-26 DIAGNOSIS — M4322 Fusion of spine, cervical region: Secondary | ICD-10-CM | POA: Diagnosis not present

## 2017-01-28 DIAGNOSIS — M542 Cervicalgia: Secondary | ICD-10-CM | POA: Diagnosis not present

## 2017-01-28 DIAGNOSIS — M4322 Fusion of spine, cervical region: Secondary | ICD-10-CM | POA: Diagnosis not present

## 2017-02-03 DIAGNOSIS — M4322 Fusion of spine, cervical region: Secondary | ICD-10-CM | POA: Diagnosis not present

## 2017-02-03 DIAGNOSIS — M542 Cervicalgia: Secondary | ICD-10-CM | POA: Diagnosis not present

## 2017-02-05 DIAGNOSIS — M4322 Fusion of spine, cervical region: Secondary | ICD-10-CM | POA: Diagnosis not present

## 2017-02-05 DIAGNOSIS — M542 Cervicalgia: Secondary | ICD-10-CM | POA: Diagnosis not present

## 2017-02-09 DIAGNOSIS — M542 Cervicalgia: Secondary | ICD-10-CM | POA: Diagnosis not present

## 2017-02-09 DIAGNOSIS — M4322 Fusion of spine, cervical region: Secondary | ICD-10-CM | POA: Diagnosis not present

## 2017-02-12 DIAGNOSIS — M4322 Fusion of spine, cervical region: Secondary | ICD-10-CM | POA: Diagnosis not present

## 2017-02-12 DIAGNOSIS — M542 Cervicalgia: Secondary | ICD-10-CM | POA: Diagnosis not present

## 2017-02-18 DIAGNOSIS — E1065 Type 1 diabetes mellitus with hyperglycemia: Secondary | ICD-10-CM | POA: Diagnosis not present

## 2017-02-18 DIAGNOSIS — E109 Type 1 diabetes mellitus without complications: Secondary | ICD-10-CM | POA: Diagnosis not present

## 2017-02-18 DIAGNOSIS — Z794 Long term (current) use of insulin: Secondary | ICD-10-CM | POA: Diagnosis not present

## 2017-02-24 DIAGNOSIS — M542 Cervicalgia: Secondary | ICD-10-CM | POA: Diagnosis not present

## 2017-03-04 DIAGNOSIS — F411 Generalized anxiety disorder: Secondary | ICD-10-CM | POA: Diagnosis not present

## 2017-03-04 DIAGNOSIS — F331 Major depressive disorder, recurrent, moderate: Secondary | ICD-10-CM | POA: Diagnosis not present

## 2017-03-04 DIAGNOSIS — B029 Zoster without complications: Secondary | ICD-10-CM | POA: Diagnosis not present

## 2017-03-26 ENCOUNTER — Ambulatory Visit
Admission: RE | Admit: 2017-03-26 | Discharge: 2017-03-26 | Disposition: A | Discharge status: Home / Self Care | Payer: BLUE CROSS/BLUE SHIELD | Source: Ambulatory Visit | Attending: Family Medicine | Admitting: Family Medicine

## 2017-03-26 ENCOUNTER — Other Ambulatory Visit: Payer: Self-pay | Admitting: Family Medicine

## 2017-03-26 DIAGNOSIS — K219 Gastro-esophageal reflux disease without esophagitis: Secondary | ICD-10-CM | POA: Diagnosis not present

## 2017-03-26 DIAGNOSIS — R109 Unspecified abdominal pain: Secondary | ICD-10-CM | POA: Diagnosis not present

## 2017-03-26 DIAGNOSIS — R319 Hematuria, unspecified: Secondary | ICD-10-CM | POA: Diagnosis not present

## 2017-03-26 DIAGNOSIS — R1013 Epigastric pain: Secondary | ICD-10-CM

## 2017-03-26 DIAGNOSIS — M549 Dorsalgia, unspecified: Secondary | ICD-10-CM

## 2017-03-27 ENCOUNTER — Emergency Department (HOSPITAL_COMMUNITY): Payer: BLUE CROSS/BLUE SHIELD

## 2017-03-27 ENCOUNTER — Encounter (HOSPITAL_COMMUNITY): Payer: Self-pay | Admitting: Emergency Medicine

## 2017-03-27 ENCOUNTER — Emergency Department (HOSPITAL_COMMUNITY)
Admission: EM | Admit: 2017-03-27 | Discharge: 2017-03-27 | Disposition: A | Discharge status: Home / Self Care | Payer: BLUE CROSS/BLUE SHIELD | Attending: Emergency Medicine | Admitting: Emergency Medicine

## 2017-03-27 DIAGNOSIS — I10 Essential (primary) hypertension: Secondary | ICD-10-CM | POA: Diagnosis not present

## 2017-03-27 DIAGNOSIS — Z7982 Long term (current) use of aspirin: Secondary | ICD-10-CM | POA: Diagnosis not present

## 2017-03-27 DIAGNOSIS — N83209 Unspecified ovarian cyst, unspecified side: Secondary | ICD-10-CM | POA: Diagnosis not present

## 2017-03-27 DIAGNOSIS — E119 Type 2 diabetes mellitus without complications: Secondary | ICD-10-CM | POA: Insufficient documentation

## 2017-03-27 DIAGNOSIS — R51 Headache: Secondary | ICD-10-CM | POA: Diagnosis not present

## 2017-03-27 DIAGNOSIS — R109 Unspecified abdominal pain: Secondary | ICD-10-CM | POA: Diagnosis not present

## 2017-03-27 DIAGNOSIS — R0789 Other chest pain: Secondary | ICD-10-CM | POA: Insufficient documentation

## 2017-03-27 DIAGNOSIS — Z79899 Other long term (current) drug therapy: Secondary | ICD-10-CM | POA: Insufficient documentation

## 2017-03-27 DIAGNOSIS — R6883 Chills (without fever): Secondary | ICD-10-CM | POA: Diagnosis not present

## 2017-03-27 DIAGNOSIS — Z87891 Personal history of nicotine dependence: Secondary | ICD-10-CM | POA: Insufficient documentation

## 2017-03-27 DIAGNOSIS — Z794 Long term (current) use of insulin: Secondary | ICD-10-CM | POA: Insufficient documentation

## 2017-03-27 DIAGNOSIS — M545 Low back pain: Secondary | ICD-10-CM | POA: Diagnosis not present

## 2017-03-27 DIAGNOSIS — R0602 Shortness of breath: Secondary | ICD-10-CM | POA: Diagnosis not present

## 2017-03-27 DIAGNOSIS — N838 Other noninflammatory disorders of ovary, fallopian tube and broad ligament: Secondary | ICD-10-CM | POA: Diagnosis not present

## 2017-03-27 HISTORY — DX: Zoster without complications: B02.9

## 2017-03-27 LAB — CBC WITH DIFFERENTIAL/PLATELET
BASOS ABS: 0.1 10*3/uL (ref 0.0–0.1)
BASOS PCT: 1 %
Eosinophils Absolute: 0.3 10*3/uL (ref 0.0–0.7)
Eosinophils Relative: 3 %
HEMATOCRIT: 37.9 % (ref 36.0–46.0)
HEMOGLOBIN: 12.9 g/dL (ref 12.0–15.0)
Lymphocytes Relative: 12 %
Lymphs Abs: 1.5 10*3/uL (ref 0.7–4.0)
MCH: 30.6 pg (ref 26.0–34.0)
MCHC: 34 g/dL (ref 30.0–36.0)
MCV: 90 fL (ref 78.0–100.0)
Monocytes Absolute: 0.5 10*3/uL (ref 0.1–1.0)
Monocytes Relative: 4 %
NEUTROS ABS: 9.6 10*3/uL — AB (ref 1.7–7.7)
NEUTROS PCT: 80 %
Platelets: 276 10*3/uL (ref 150–400)
RBC: 4.21 MIL/uL (ref 3.87–5.11)
RDW: 14.2 % (ref 11.5–15.5)
WBC: 11.9 10*3/uL — AB (ref 4.0–10.5)

## 2017-03-27 LAB — URINALYSIS, ROUTINE W REFLEX MICROSCOPIC
Bilirubin Urine: NEGATIVE
GLUCOSE, UA: 150 mg/dL — AB
Ketones, ur: NEGATIVE mg/dL
Nitrite: NEGATIVE
PH: 6 (ref 5.0–8.0)
PROTEIN: NEGATIVE mg/dL
Specific Gravity, Urine: 1.014 (ref 1.005–1.030)

## 2017-03-27 LAB — I-STAT CHEM 8, ED
BUN: 11 mg/dL (ref 6–20)
CHLORIDE: 100 mmol/L — AB (ref 101–111)
Calcium, Ion: 1.08 mmol/L — ABNORMAL LOW (ref 1.15–1.40)
Creatinine, Ser: 0.9 mg/dL (ref 0.44–1.00)
Glucose, Bld: 213 mg/dL — ABNORMAL HIGH (ref 65–99)
HEMATOCRIT: 41 % (ref 36.0–46.0)
HEMOGLOBIN: 13.9 g/dL (ref 12.0–15.0)
POTASSIUM: 4 mmol/L (ref 3.5–5.1)
Sodium: 137 mmol/L (ref 135–145)
TCO2: 25 mmol/L (ref 0–100)

## 2017-03-27 LAB — HCG, QUANTITATIVE, PREGNANCY: hCG, Beta Chain, Quant, S: 3 m[IU]/mL (ref <5)

## 2017-03-27 MED ORDER — CYCLOBENZAPRINE HCL 10 MG PO TABS: 10.0000 mg | ORAL_TABLET | Freq: Two times a day (BID) | ORAL | 0 refills | Status: DC | PRN

## 2017-03-27 MED ORDER — IOPAMIDOL (ISOVUE-370) INJECTION 76%
100.0000 mL | Freq: Once | INTRAVENOUS | Status: AC | PRN
  Administered 2017-03-27: 100 mL via INTRAVENOUS

## 2017-03-27 MED ORDER — KETOROLAC TROMETHAMINE 30 MG/ML IJ SOLN
30.0000 mg | Freq: Once | INTRAMUSCULAR | Status: AC
  Administered 2017-03-27: 30 mg via INTRAVENOUS
  Filled 2017-03-27: qty 1

## 2017-03-27 MED ORDER — MORPHINE SULFATE (PF) 4 MG/ML IV SOLN
4.0000 mg | Freq: Once | INTRAVENOUS | Status: AC
  Administered 2017-03-27: 4 mg via INTRAVENOUS
  Filled 2017-03-27: qty 1

## 2017-03-27 MED ORDER — KETOROLAC TROMETHAMINE 30 MG/ML IJ SOLN: 30.0000 mg | Freq: Once | INTRAMUSCULAR | Status: DC

## 2017-03-27 MED ORDER — ETODOLAC 300 MG PO CAPS: 300.0000 mg | ORAL_CAPSULE | Freq: Two times a day (BID) | ORAL | 0 refills | Status: DC | PRN

## 2017-03-27 MED ORDER — IOPAMIDOL (ISOVUE-370) INJECTION 76%
INTRAVENOUS | Status: AC
  Filled 2017-03-27: qty 100

## 2017-03-27 NOTE — ED Provider Notes (Signed)
[facility] DEPT Provider Note   CSN: [id number] Arrival date & time: 03/27/17  0533     History   Chief Complaint Chief Complaint  Patient presents with  . Flank Pain    HPI Natalie Ware is a 51 y.o. female.  HPI   51 year old female with history of diabetes, chronic headache, anxiety, seizure, constipation presenting complaining of flank pain. Patient report for the past 5 days she has had persistent pain to her right flank. She described pain as a sharp stabbing pain radiates to the front of her abdomen and sometimes towards the mid chest. Pain seems to be worsening with movement but present even at rest. Pain is currently 8 out of 10. She endorse occasional chills. She now complaining of sensation of swelling in her head, not similar to her usual headache. She has been taking gabapentin at home with minimal relief. She was seen by her primary care doctor yesterday for her complaint. Patient mentioned she has blood work, as well as x-ray of her chest and abdomen. She was told that she has bloody urine. She was encouraged to come to the ER if her symptoms worsen. She denies any prior history of kidney stones. No history of AAA.  Past Medical History:  Diagnosis Date  . Anxiety   . Chronic headache   . Constipation   . Dyspnea   . Hypertension   . Insulin dependent diabetes mellitus (HCC)    insulin pump  . Seizure ([place])   . Shingles   . TIA (transient ischemic attack)     Patient Active Problem List   Diagnosis Date Noted  . Multiple somatic complaints 01/12/2013  . Bronchitis 09/08/2011  . Screening for malignant neoplasm of the cervix 09/08/2011  . Routine gynecological examination 09/08/2011  . Chest pain 02/17/2011  . Back pain 02/17/2011  . Nausea 02/17/2011  . DIABETES MELLITUS, TYPE I 10/22/2010  . ANXIETY STATE, UNSPECIFIED 10/22/2010  . PATENT FORAMEN OVALE 10/22/2010  . GALACTORRHEA NOT ASSOCIATED WITH CHILDBIRTH 04/25/2009  . CONSTIPATION 04/03/2009    . DYSPNEA 04/03/2009  . DM 03/30/2009  . SMOKER 03/30/2009  . HYPERTENSION 03/30/2009  . TIA 03/30/2009    Past Surgical History:  Procedure Laterality Date  . cervicle fusion    . FOREIGN BODY REMOVAL     right index finger  . HERNIA REPAIR      OB History    No data available       Home Medications    Prior to Admission medications   Medication Sig Start Date End Date Taking? Authorizing Provider  acetaminophen (TYLENOL) 500 MG tablet Take 1,000 mg by mouth every 6 (six) hours as needed for mild pain.   Yes [provider]  ALPRAZolam Duanne Moron) 0.5 MG tablet Take 0.5 mg by mouth daily as needed for anxiety.  12/26/15  Yes [provider]  aspirin 81 MG tablet Take 162 mg by mouth daily.    Yes [provider]  escitalopram (LEXAPRO) 20 MG tablet Take 20 mg by mouth daily. 11/23/15  Yes [provider]  gabapentin (NEURONTIN) 100 MG capsule Take 1-3 capsules by mouth 3 (three) times daily. 03/24/17  Yes [provider]  losartan (COZAAR) 50 MG tablet Take 50 mg by mouth daily. 01/23/17  Yes [provider]  NOVOLOG 100 UNIT/ML injection 25-30 Units. Inject into skin as directed VIA Pump 06/20/15  Yes [provider]    Family History Family History  Problem Relation Age of Onset  .  Colon cancer Maternal Grandfather   . Cancer Maternal Grandfather        colon  . Diabetes Mother   . Ovarian cancer Paternal Aunt   . Cancer Paternal Aunt        ovarian    Social History Social History  Substance Use Topics  . Smoking status: Former Smoker    Quit date: 09/08/2008  . Smokeless tobacco: Never Used  . Alcohol use Yes     Comment: social     Allergies   Insulin glargine; Lubiprostone; and Metoclopramide hcl   Review of Systems Review of Systems  All other systems reviewed and are negative.    Physical Exam Updated Vital Signs BP (!) 167/91 (BP Location: Right Arm)   Pulse 98   Temp 98.5 F (36.9  C) (Oral)   Resp 18   SpO2 100%   Physical Exam  Constitutional: She appears well-developed and well-nourished. No distress.  Patient appears mildly uncomfortable but nontoxic in appearance  HENT:  Head: Atraumatic.  Eyes: Conjunctivae are normal.  Neck: Neck supple.  Cardiovascular: Normal rate, regular rhythm and intact distal pulses.   Pulmonary/Chest: Effort normal and breath sounds normal.  Abdominal: Soft. She exhibits no distension. There is no tenderness.  No abdominal bruit or pulsatile mass  Genitourinary:  Genitourinary Comments: Bilateral CVA tenderness on percussion.  Musculoskeletal:  Able to move all 4 extremities  Neurological: She is alert. She displays normal reflexes.  Skin: No rash noted.  Psychiatric: She has a normal mood and affect.  Nursing note and vitals reviewed.    ED Treatments / Results  Labs (all labs ordered are listed, but only abnormal results are displayed) Labs Reviewed  URINALYSIS, ROUTINE W REFLEX MICROSCOPIC - Abnormal; Notable for the following:       Result Value   APPearance HAZY (*)    Glucose, UA 150 (*)    Hgb urine dipstick MODERATE (*)    Leukocytes, UA SMALL (*)    Bacteria, UA RARE (*)    Squamous Epithelial / LPF 0-5 (*)    All other components within normal limits  CBC WITH DIFFERENTIAL/PLATELET - Abnormal; Notable for the following:    WBC 11.9 (*)    Neutro Abs 9.6 (*)    All other components within normal limits  I-STAT CHEM 8, ED - Abnormal; Notable for the following:    Chloride 100 (*)    Glucose, Bld 213 (*)    Calcium, Ion 1.08 (*)    All other components within normal limits  HCG, QUANTITATIVE, PREGNANCY    EKG  EKG Interpretation None       Radiology Dg Thoracic Spine W/swimmers  Result Date: 03/26/2017 CLINICAL DATA:  Posterior thoracolumbar pain radiating to RIGHT lateral area for 3 days, back pain of unspecified chronicity, sternal pain, no trauma, epigastric pain EXAM: THORACIC SPINE - 3  VIEWS COMPARISON:  Chest radiographs 10/31/2007 FINDINGS: Twelve pairs of ribs. Bones appear demineralized. Vertebral body and disc space heights maintained. No acute fracture, subluxation, or bone destruction. Prior cervical spine fusion C5-C6 with disc prosthesis. Visualized posterior ribs unremarkable. IMPRESSION: No acute thoracic spine abnormalities. Electronically Signed   By: Lavonia Dana M.D.   On: 03/26/2017 16:56   Dg Abd 1 View  Result Date: 03/26/2017 CLINICAL DATA:  Posterior abdominal pain radiating to the RIGHT lateral area for 3 days, history hypertension EXAM: ABDOMEN - 1 VIEW COMPARISON:  CT abdomen and pelvis 06/28/2015 FINDINGS: Electronic device projects over the LEFT upper  quadrant. Normal bowel gas pattern. No bowel dilatation or bowel wall thickening. Scattered stool in RIGHT colon. Bones appear demineralized without acute abnormality. No urinary tract calcification. IMPRESSION: No acute abnormalities. Electronically Signed   By: Lavonia Dana M.D.   On: 03/26/2017 16:55   US Transvaginal Non-ob  Result Date: 03/27/2017 CLINICAL DATA:  Abnormal appearing the uterus on CT.  Low back pain. EXAM: TRANSABDOMINAL AND TRANSVAGINAL ULTRASOUND OF PELVIS TECHNIQUE: Study was performed transabdominally to optimize pelvic field of view evaluation and transvaginally to optimize internal visceral architecture evaluation. COMPARISON:  March 27, 2017 CT pelvis FINDINGS: Uterus Measurements: 8.0 x 4.1 x 5.3 cm. Uterus is somewhat inhomogeneous in a generalized manner. There is a mass in the superior fundus measuring 0.9 x 0.7 x 0.7 cm. A larger predominantly hypoechoic mass in the uterine fundus just superior to the endometrium measures 1.9 x 1.8 x 2.4 cm. Endometrium Thickness: 8 mm.  No focal abnormality visualized. Right ovary Measurements: 2.5 x 1.2 x 2.5 cm. Within the right ovary, there is a partially calcified mass measuring 1.1 x 0.8 x 1.0 cm. Left ovary Measurements: 2.9 x 0.9 x 1.1 cm. Normal  appearance/no adnexal mass. Other findings No abnormal free fluid. IMPRESSION: 1. The uterus has an inhomogeneous echotexture pattern consistent with leiomyomatous change. Well-defined leiomyoma is noted in the superior fundus as noted above. 2. Patient reports premenopausal state. Endometrium within normal limits for premenopausal state. 3. Partially calcified right ovarian mass measuring 1.1 x 0.8 x 1.0 cm. Question etiology. This finding could represent residua of hemorrhagic cyst. Given this somewhat unusual appearance, follow-up pelvic ultrasound in 3 months advised to assess for stability. Electronically Signed   By: Lowella Grip III M.D.   On: 03/27/2017 09:59   US Pelvis Complete  Result Date: 03/27/2017 CLINICAL DATA:  Abnormal appearing the uterus on CT.  Low back pain. EXAM: TRANSABDOMINAL AND TRANSVAGINAL ULTRASOUND OF PELVIS TECHNIQUE: Study was performed transabdominally to optimize pelvic field of view evaluation and transvaginally to optimize internal visceral architecture evaluation. COMPARISON:  March 27, 2017 CT pelvis FINDINGS: Uterus Measurements: 8.0 x 4.1 x 5.3 cm. Uterus is somewhat inhomogeneous in a generalized manner. There is a mass in the superior fundus measuring 0.9 x 0.7 x 0.7 cm. A larger predominantly hypoechoic mass in the uterine fundus just superior to the endometrium measures 1.9 x 1.8 x 2.4 cm. Endometrium Thickness: 8 mm.  No focal abnormality visualized. Right ovary Measurements: 2.5 x 1.2 x 2.5 cm. Within the right ovary, there is a partially calcified mass measuring 1.1 x 0.8 x 1.0 cm. Left ovary Measurements: 2.9 x 0.9 x 1.1 cm. Normal appearance/no adnexal mass. Other findings No abnormal free fluid. IMPRESSION: 1. The uterus has an inhomogeneous echotexture pattern consistent with leiomyomatous change. Well-defined leiomyoma is noted in the superior fundus as noted above. 2. Patient reports premenopausal state. Endometrium within normal limits for premenopausal  state. 3. Partially calcified right ovarian mass measuring 1.1 x 0.8 x 1.0 cm. Question etiology. This finding could represent residua of hemorrhagic cyst. Given this somewhat unusual appearance, follow-up pelvic ultrasound in 3 months advised to assess for stability. Electronically Signed   By: Lowella Grip III M.D.   On: 03/27/2017 09:59   Ct Angio Chest/abd/pel For Dissection W And/or Wo Contrast  Result Date: 03/27/2017 CLINICAL DATA:  Right flank pain radiating to abdomen. Sternal pain with walking. Low-grade fever. Short of breath for 5 days. History of neck fusion. EXAM: CT ANGIOGRAPHY CHEST, ABDOMEN AND PELVIS  TECHNIQUE: Multidetector CT imaging through the chest, abdomen and pelvis was performed using the standard protocol during bolus administration of intravenous contrast. Multiplanar reconstructed images and MIPs were obtained and reviewed to evaluate the vascular anatomy. CONTRAST:  100 cc Isovue 370 COMPARISON:  None. FINDINGS: CTA CHEST FINDINGS Cardiovascular: There is motion artifact affecting the appearance of the ascending aorta. A small amount of fluid is present in the pericardial phrenic recess and anterior to the aorta and pulmonary artery. There is no convincing evidence of dissection or intramural hematoma. There is no evidence of aortic aneurysm. There are no significant areas of atherosclerotic plaque in the aortic arch. There is no obvious pulmonary thromboembolism. Great vessels are patent within the confines of the examination. There is early takeoff of the left vertebral artery. Mediastinum/Nodes: Allowing for residual thymic tissue. There is no abnormal adenopathy. There is no convincing evidence of mediastinal hemorrhage. Esophagus is within normal limits. The visualized thyroid is normal in appearance. Lungs/Pleura: No pneumothorax.  No pleural effusion Lungs are clear. Musculoskeletal: No vertebral compression deformity. No evidence of rib fracture. No evidence of sternal  deformity Review of the MIP images confirms the above findings. CTA ABDOMEN AND PELVIS FINDINGS VASCULAR Aorta: The aorta is non aneurysmal and patent. There is no evidence of aortic dissection. 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 Branch vessels patent. SMA: Patent. Renals: 2 right renal arteries and 2 left renal arteries are patent. IMA: Patent. Inflow: There is poor contrast opacification below the aortic bifurcation within the iliac arteries. Bilateral common, external, and internal iliac arteries are grossly patent. Review of the MIP images confirms the above findings. NON-VASCULAR Hepatobiliary: There is a focal blush of contrast in the lateral segment of the left lobe measuring 9 mm. See image 83 of series 5. It is lobulated in appearance. The liver parenchyma and gallbladder are otherwise within normal limits. Pancreas: Unremarkable Spleen: Within normal limits.  Small splenule. Adrenals/Urinary Tract: Adrenal glands are unremarkable. Simple cyst in the lower pole of the right kidney. The renal parenchyma is within normal limits. Bladder is unremarkable. Stomach/Bowel: Stomach and duodenum are within normal limits. There is no evidence of small-bowel obstruction. The appendix is normal. Moderate stool burden throughout the length of the colon. No evidence of mass in the colon. Sigmoid colon is relatively decompressed. Lymphatic: No abnormal retroperitoneal adenopathy. Small para-aortic nodes are noted. Reproductive: The uterus enhances heterogeneously. There is a large area of central low density within the fundus. Findings may represent endometrial pathology or adenomyosis. Given the appearance, fibroids would be less likely. Ovaries are within normal limits without evidence of mass. Cervix is unremarkable. Other: There is no free fluid. Umbilical hernia contains adipose tissue. Musculoskeletal: There is no vertebral compression deformity. Right paracentral L5-S1 disc  protrusion is shallow. Review of the MIP images confirms the above findings. IMPRESSION: Vascular: There is no evidence of aortic dissection within the thorax or abdomen. There is no acute arterial pathology visualized on this study. Nonvascular: There is a 9 mm avidly enhancing lobulated lesion in the left lobe of the liver. If the patient has a history or high risk of malignancy, six-month follow-up MRI is recommended. Otherwise, this is most likely a benign entity such as hemangioma. Abnormal central enhancement of the uterus as described. Ultrasound may be helpful to further characterize. Ultimately, MRI may be required to better delineate pathology. Shallow right paracentral L5-S1 disc protrusion. Electronically Signed   By: Marybelle Killings M.D.   On: 03/27/2017  08:03    Procedures Procedures (including critical care time)  Medications Ordered in ED Medications  morphine 4 MG/ML injection 4 mg (4 mg Intravenous Given 03/27/17 0726)  iopamidol (ISOVUE-370) 76 % injection 100 mL (100 mLs Intravenous Contrast Given 03/27/17 0732)  ketorolac (TORADOL) 30 MG/ML injection 30 mg (30 mg Intravenous Given 03/27/17 0858)     Initial Impression / Assessment and Plan / ED Course  I have reviewed the triage vital signs and the nursing notes.  Pertinent labs & imaging results that were available during my care of the patient were reviewed by me and considered in my medical decision making (see chart for details).     BP 119/88 (BP Location: Left Arm)   Pulse 68   Temp 98.5 F (36.9 C) (Oral)   Resp 18   SpO2 99%    Final Clinical Impressions(s) / ED Diagnoses   Final diagnoses:  Right flank pain  Hemorrhagic ovarian cyst    New Prescriptions New Prescriptions   CYCLOBENZAPRINE (FLEXERIL) 10 MG TABLET    Take 1 tablet (10 mg total) by mouth 2 (two) times daily as needed for muscle spasms.   ETODOLAC (LODINE) 300 MG CAPSULE    Take 1 capsule (300 mg total) by mouth 2 (two) times daily as needed  for moderate pain.   7:10 AM Patient here with bilateral flank pain and now having midsternal chest pain. She appears uncomfortable. She is not having history of AAA in the past. No prior history of kidney stone. Given presentation, I discussed with Dr. Ellender Hose, plan to obtain CT angiogram of chest abdomen and pelvis to rule out dissection and to assess for kidney stone. Pain medication given. Care discussed with Dr. Ellender Hose  8:12 AM CT angiogram of the chest abdomen pelvis was obtained showing no evidence of dissection. This is a 9 mm enhancing lobulated lesion in the left lobe liver. Patient is a former smoker. I encouraged patient to have a follow-up MRI in 6 months. 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  Also, There is an abnormal central enhancements of the uterus in which an MRI may be better to delineate pathology. However ultrasound may be useful acutely.    I will order pelvic US.  Pt still appear to be very uncomfortable after receiving morphine.  Will give toradol and will monitor closely.  No evidence of kidney stone.    10:36 AM Pelvic ultrasound demonstrate a well-defined Uterine fibroid measuring approximately 1 cm in diameter noted to the superior fundus. There is a partial calcified right ovarian mass approximately 1 cm in diameter which could represent a residual hemorrhagic cyst however, patient may need a follow-up pelvic ultrasound 3 months. No other concerning feature. I suspect patient's pain may be related to a hemorrhagic cysts causing discomfort. She feels much better after receiving Toradol. I discussed the results of her CT scan as well as her ultrasound.  Recommend f/u with appropriate imaging.  Will prescribe NSAIDs.  Return precaution given.     Domenic Moras, PA-C 03/27/17 1049    Duffy Bruce, MD 03/30/17 [phone]

## 2017-03-27 NOTE — ED Notes (Signed)
PT in restroom providing urine sample

## 2017-03-27 NOTE — Discharge Instructions (Signed)
You have been evaluated for your flank and abdominal pain.  Your pain may be due to a ruptured ovarian cyst.  Please take pain medication as prescribed.  Follow up with your doctor for further care.  You may need a repeat pelvis ultrasound in 3 months.  Return if your condition worsen.

## 2017-03-27 NOTE — ED Notes (Signed)
Patient transported to CT 

## 2017-03-27 NOTE — ED Triage Notes (Signed)
Pt states 5 days ago she started having pain in her right flank area that radiates around to the front  Pt states it has progressively gotten worse  Pt states tonight she started having chills  Pt states she went to her dr and had blood work, urine test, and xrays of her abdomen done  Pt states she was told she had blood in her urine and her potassium was elevated  Pt states today she has a headache and the pain in her side goes from her spine to her sternum

## 2017-03-27 NOTE — ED Notes (Signed)
US at bedside

## 2017-03-30 DIAGNOSIS — F411 Generalized anxiety disorder: Secondary | ICD-10-CM | POA: Diagnosis not present

## 2017-03-30 DIAGNOSIS — I10 Essential (primary) hypertension: Secondary | ICD-10-CM | POA: Diagnosis not present

## 2017-03-30 DIAGNOSIS — E785 Hyperlipidemia, unspecified: Secondary | ICD-10-CM | POA: Diagnosis not present

## 2017-03-30 DIAGNOSIS — E782 Mixed hyperlipidemia: Secondary | ICD-10-CM | POA: Diagnosis not present

## 2017-03-30 DIAGNOSIS — N83201 Unspecified ovarian cyst, right side: Secondary | ICD-10-CM | POA: Diagnosis not present

## 2017-05-21 DIAGNOSIS — Z794 Long term (current) use of insulin: Secondary | ICD-10-CM | POA: Diagnosis not present

## 2017-06-03 DIAGNOSIS — Z23 Encounter for immunization: Secondary | ICD-10-CM | POA: Diagnosis not present

## 2017-06-03 DIAGNOSIS — M65312 Trigger thumb, left thumb: Secondary | ICD-10-CM | POA: Diagnosis not present

## 2017-06-03 DIAGNOSIS — M65319 Trigger thumb, unspecified thumb: Secondary | ICD-10-CM | POA: Diagnosis not present

## 2017-06-03 DIAGNOSIS — N951 Menopausal and female climacteric states: Secondary | ICD-10-CM | POA: Diagnosis not present

## 2017-06-03 DIAGNOSIS — I10 Essential (primary) hypertension: Secondary | ICD-10-CM | POA: Diagnosis not present

## 2017-06-03 DIAGNOSIS — F411 Generalized anxiety disorder: Secondary | ICD-10-CM | POA: Diagnosis not present

## 2017-06-03 DIAGNOSIS — Z6830 Body mass index (BMI) 30.0-30.9, adult: Secondary | ICD-10-CM | POA: Diagnosis not present

## 2017-06-12 ENCOUNTER — Encounter: Payer: Self-pay | Admitting: Gastroenterology

## 2017-06-26 DIAGNOSIS — N951 Menopausal and female climacteric states: Secondary | ICD-10-CM | POA: Diagnosis not present

## 2017-06-26 DIAGNOSIS — Z13220 Encounter for screening for lipoid disorders: Secondary | ICD-10-CM | POA: Diagnosis not present

## 2017-07-01 ENCOUNTER — Other Ambulatory Visit: Payer: Self-pay | Admitting: Family Medicine

## 2017-07-01 DIAGNOSIS — N83201 Unspecified ovarian cyst, right side: Secondary | ICD-10-CM | POA: Diagnosis not present

## 2017-07-01 DIAGNOSIS — N83202 Unspecified ovarian cyst, left side: Secondary | ICD-10-CM

## 2017-07-01 DIAGNOSIS — E782 Mixed hyperlipidemia: Secondary | ICD-10-CM | POA: Diagnosis not present

## 2017-07-01 DIAGNOSIS — D1803 Hemangioma of intra-abdominal structures: Secondary | ICD-10-CM | POA: Diagnosis not present

## 2017-07-01 DIAGNOSIS — E109 Type 1 diabetes mellitus without complications: Secondary | ICD-10-CM | POA: Diagnosis not present

## 2017-07-01 DIAGNOSIS — D219 Benign neoplasm of connective and other soft tissue, unspecified: Secondary | ICD-10-CM

## 2017-07-02 DIAGNOSIS — M65312 Trigger thumb, left thumb: Secondary | ICD-10-CM | POA: Diagnosis not present

## 2017-07-06 DIAGNOSIS — H35372 Puckering of macula, left eye: Secondary | ICD-10-CM | POA: Diagnosis not present

## 2017-07-06 DIAGNOSIS — H43811 Vitreous degeneration, right eye: Secondary | ICD-10-CM | POA: Diagnosis not present

## 2017-07-06 DIAGNOSIS — E103513 Type 1 diabetes mellitus with proliferative diabetic retinopathy with macular edema, bilateral: Secondary | ICD-10-CM | POA: Diagnosis not present

## 2017-07-06 DIAGNOSIS — H04123 Dry eye syndrome of bilateral lacrimal glands: Secondary | ICD-10-CM | POA: Diagnosis not present

## 2017-07-08 ENCOUNTER — Other Ambulatory Visit: Payer: BLUE CROSS/BLUE SHIELD

## 2017-07-16 DIAGNOSIS — Z794 Long term (current) use of insulin: Secondary | ICD-10-CM | POA: Diagnosis not present

## 2017-07-16 DIAGNOSIS — E109 Type 1 diabetes mellitus without complications: Secondary | ICD-10-CM | POA: Diagnosis not present

## 2017-07-22 ENCOUNTER — Other Ambulatory Visit: Payer: Self-pay | Admitting: Family Medicine

## 2017-07-22 DIAGNOSIS — D1803 Hemangioma of intra-abdominal structures: Secondary | ICD-10-CM

## 2017-08-06 DIAGNOSIS — H04123 Dry eye syndrome of bilateral lacrimal glands: Secondary | ICD-10-CM | POA: Diagnosis not present

## 2017-08-06 DIAGNOSIS — H35372 Puckering of macula, left eye: Secondary | ICD-10-CM | POA: Diagnosis not present

## 2017-08-06 DIAGNOSIS — H25813 Combined forms of age-related cataract, bilateral: Secondary | ICD-10-CM | POA: Diagnosis not present

## 2017-08-06 DIAGNOSIS — E103513 Type 1 diabetes mellitus with proliferative diabetic retinopathy with macular edema, bilateral: Secondary | ICD-10-CM | POA: Diagnosis not present

## 2017-08-14 DIAGNOSIS — Z794 Long term (current) use of insulin: Secondary | ICD-10-CM | POA: Diagnosis not present

## 2017-09-26 DIAGNOSIS — M542 Cervicalgia: Secondary | ICD-10-CM | POA: Diagnosis not present

## 2017-10-09 ENCOUNTER — Ambulatory Visit: Payer: BLUE CROSS/BLUE SHIELD | Admitting: Physician Assistant

## 2017-10-09 DIAGNOSIS — J45909 Unspecified asthma, uncomplicated: Secondary | ICD-10-CM | POA: Diagnosis not present

## 2017-10-09 DIAGNOSIS — J069 Acute upper respiratory infection, unspecified: Secondary | ICD-10-CM | POA: Diagnosis not present

## 2017-10-09 DIAGNOSIS — A084 Viral intestinal infection, unspecified: Secondary | ICD-10-CM | POA: Diagnosis not present

## 2017-10-22 DIAGNOSIS — E782 Mixed hyperlipidemia: Secondary | ICD-10-CM | POA: Diagnosis not present

## 2017-10-22 DIAGNOSIS — F331 Major depressive disorder, recurrent, moderate: Secondary | ICD-10-CM | POA: Diagnosis not present

## 2017-10-22 DIAGNOSIS — F411 Generalized anxiety disorder: Secondary | ICD-10-CM | POA: Diagnosis not present

## 2017-10-22 DIAGNOSIS — M65319 Trigger thumb, unspecified thumb: Secondary | ICD-10-CM | POA: Diagnosis not present

## 2017-10-28 ENCOUNTER — Encounter (HOSPITAL_BASED_OUTPATIENT_CLINIC_OR_DEPARTMENT_OTHER): Payer: Self-pay | Admitting: *Deleted

## 2017-10-28 ENCOUNTER — Other Ambulatory Visit: Payer: Self-pay

## 2017-10-28 ENCOUNTER — Other Ambulatory Visit: Payer: Self-pay | Admitting: Orthopedic Surgery

## 2017-10-28 DIAGNOSIS — M65312 Trigger thumb, left thumb: Secondary | ICD-10-CM | POA: Diagnosis not present

## 2017-10-30 ENCOUNTER — Encounter (HOSPITAL_BASED_OUTPATIENT_CLINIC_OR_DEPARTMENT_OTHER)
Admission: RE | Admit: 2017-10-30 | Discharge: 2017-10-30 | Disposition: A | Discharge status: Home / Self Care | Payer: BLUE CROSS/BLUE SHIELD | Source: Ambulatory Visit | Attending: Orthopedic Surgery | Admitting: Orthopedic Surgery

## 2017-10-30 ENCOUNTER — Other Ambulatory Visit: Payer: Self-pay

## 2017-10-30 DIAGNOSIS — Z87891 Personal history of nicotine dependence: Secondary | ICD-10-CM | POA: Diagnosis not present

## 2017-10-30 DIAGNOSIS — Z794 Long term (current) use of insulin: Secondary | ICD-10-CM | POA: Diagnosis not present

## 2017-10-30 DIAGNOSIS — I10 Essential (primary) hypertension: Secondary | ICD-10-CM | POA: Diagnosis not present

## 2017-10-30 DIAGNOSIS — M65312 Trigger thumb, left thumb: Secondary | ICD-10-CM | POA: Diagnosis not present

## 2017-10-30 DIAGNOSIS — Z79899 Other long term (current) drug therapy: Secondary | ICD-10-CM | POA: Diagnosis not present

## 2017-10-30 DIAGNOSIS — Z8673 Personal history of transient ischemic attack (TIA), and cerebral infarction without residual deficits: Secondary | ICD-10-CM | POA: Diagnosis not present

## 2017-10-30 DIAGNOSIS — F419 Anxiety disorder, unspecified: Secondary | ICD-10-CM | POA: Diagnosis not present

## 2017-10-30 DIAGNOSIS — E109 Type 1 diabetes mellitus without complications: Secondary | ICD-10-CM | POA: Diagnosis not present

## 2017-10-30 LAB — BASIC METABOLIC PANEL
ANION GAP: 11 (ref 5–15)
BUN: 11 mg/dL (ref 6–20)
CO2: 25 mmol/L (ref 22–32)
Calcium: 9.2 mg/dL (ref 8.9–10.3)
Chloride: 103 mmol/L (ref 101–111)
Creatinine, Ser: 0.78 mg/dL (ref 0.44–1.00)
Glucose, Bld: 102 mg/dL — ABNORMAL HIGH (ref 65–99)
POTASSIUM: 5.4 mmol/L — AB (ref 3.5–5.1)
SODIUM: 139 mmol/L (ref 135–145)

## 2017-10-30 NOTE — Pre-Procedure Instructions (Signed)
Dr. Renaldo Reel notified of K+ 5.4 - recheck on istat 8 DOS.

## 2017-10-30 NOTE — H&P (Signed)
Natalie Ware is an 52 y.o. female.   CC / Reason for Visit: Left thumb trigger HPI: This patient returns to clinic today indicating that she continues to lock and catch in her left thumb and she would prefer to have a trigger finger release as soon as possible.  HPI 06/03/2017:This patient is a 52 year old, right-hand-dominant, female with type 1 diabetes who indicates that she has suffered in the past with trigger fingers.  She reports that she has had a trigger finger release on the right side but for the last several months, the patient has been experiencing a left thumb trigger that actively get stuck through each flexion cycle.  It is at it's worst after working 8 hours.  She has tried splinting it as well as tried taking 800 mg of ibuprofen, neither of which were very helpful.  She is here today for further evaluation and treatment.  Past Medical History:  Diagnosis Date  . Anxiety   . Chronic headache   . Constipation   . Dyspnea   . Hypertension   . Insulin dependent diabetes mellitus (HCC)    insulin pump  . PFO (patent foramen ovale)    being followed by Dr Andrez Grime problems, no surgery  . Shingles   . TIA (transient ischemic attack)     Past Surgical History:  Procedure Laterality Date  . cervicle fusion    . FOREIGN BODY REMOVAL     right index finger  . HERNIA REPAIR    . TRIGGER FINGER RELEASE Right     Family History  Problem Relation Age of Onset  . Colon cancer Maternal Grandfather   . Cancer Maternal Grandfather        colon  . Diabetes Mother   . Ovarian cancer Paternal Aunt   . Cancer Paternal Aunt        ovarian   Social History:  reports that she quit smoking about 9 years ago. she has never used smokeless tobacco. She reports that she drinks alcohol. She reports that she does not use drugs.  Allergies:  Allergies  Allergen Reactions  . Insulin Glargine     Rash   . Lubiprostone     Built up water in RIght eye and could not see  . Metoclopramide  Hcl     lactating    No medications prior to admission.    No results found for this or any previous visit (from the past 48 hour(s)). No results found.  Review of Systems  All other systems reviewed and are negative.   Height 5\' 2"  (1.575 m), weight 72.6 kg (160 lb), last menstrual period 09/23/2017. Physical Exam  Constitutional:  WD, WN, NAD HEENT:  NCAT, EOMI Neuro/Psych:  Alert & oriented to person, place, and time; appropriate mood & affect Lymphatic: No generalized UE edema or lymphadenopathy Extremities / MSK:  Both UE are normal with respect to appearance, ranges of motion, joint stability, muscle strength/tone, sensation, & perfusion except as otherwise noted:  The left thumb is swollen.  Range of motion decreased with active locking and catching through each flexion cycle.  Light touch sensibility intact.  Good capillary refill.  Labs / Xrays:  No radiographic studies obtained today.  Assessment: Left thumb trigger finger- injected 06/03/2017, 07/02/2017  Plan:  The findings were discussed with the patient.  She wishes to proceed as soon as possible with left thumb trigger release.  The details of the operative procedure were discussed with the patient.  Questions were  invited and answered.  In addition to the goal of the procedure, the risks of the procedure to include but not limited to bleeding; infection; damage to the nerves or blood vessels that could result in bleeding, numbness, weakness, chronic pain, and the need for additional procedures; stiffness; the need for revision surgery; and anesthetic risks were reviewed.  No specific outcome was guaranteed or implied.  Informed consent was obtained.   Jolyn Nap, MD 10/30/2017, 7:01 AM

## 2017-11-01 ENCOUNTER — Encounter (HOSPITAL_BASED_OUTPATIENT_CLINIC_OR_DEPARTMENT_OTHER): Payer: Self-pay | Admitting: Anesthesiology

## 2017-11-01 NOTE — Anesthesia Preprocedure Evaluation (Addendum)
Anesthesia Evaluation  Patient identified by MRN, date of birth, ID band Patient awake    Reviewed: Allergy & Precautions, NPO status , Patient's Chart, lab work & pertinent test results  Airway Mallampati: I       Dental no notable dental hx. (+) Teeth Intact   Pulmonary former smoker,    Pulmonary exam normal breath sounds clear to auscultation       Cardiovascular hypertension, Pt. on medications  Rhythm:Regular Rate:Normal     Neuro/Psych PSYCHIATRIC DISORDERS Anxiety Depression negative neurological ROS     GI/Hepatic negative GI ROS, Neg liver ROS,   Endo/Other  diabetes, Insulin Dependent  Renal/GU negative Renal ROS  negative genitourinary   Musculoskeletal negative musculoskeletal ROS (+)   Abdominal Normal abdominal exam  (+)   Peds  Hematology   Anesthesia Other Findings   Reproductive/Obstetrics                            Anesthesia Physical Anesthesia Plan  ASA: II  Anesthesia Plan: MAC   Post-op Pain Management:    Induction:   PONV Risk Score and Plan: 2 and Ondansetron and Dexamethasone  Airway Management Planned:   Additional Equipment:   Intra-op Plan:   Post-operative Plan:   Informed Consent: I have reviewed the patients History and Physical, chart, labs and discussed the procedure including the risks, benefits and alternatives for the proposed anesthesia with the patient or authorized representative who has indicated his/her understanding and acceptance.     Plan Discussed with:   Anesthesia Plan Comments:        Anesthesia Quick Evaluation

## 2017-11-02 ENCOUNTER — Ambulatory Visit (HOSPITAL_BASED_OUTPATIENT_CLINIC_OR_DEPARTMENT_OTHER)
Admission: RE | Admit: 2017-11-02 | Discharge: 2017-11-02 | Disposition: A | Discharge status: Home / Self Care | Payer: BLUE CROSS/BLUE SHIELD | Source: Ambulatory Visit | Attending: Orthopedic Surgery | Admitting: Orthopedic Surgery

## 2017-11-02 ENCOUNTER — Encounter (HOSPITAL_BASED_OUTPATIENT_CLINIC_OR_DEPARTMENT_OTHER): Payer: Self-pay | Admitting: Certified Registered"

## 2017-11-02 ENCOUNTER — Encounter (HOSPITAL_BASED_OUTPATIENT_CLINIC_OR_DEPARTMENT_OTHER)
Admission: RE | Disposition: A | Discharge status: Home / Self Care | Payer: Self-pay | Source: Ambulatory Visit | Attending: Orthopedic Surgery

## 2017-11-02 ENCOUNTER — Ambulatory Visit (HOSPITAL_BASED_OUTPATIENT_CLINIC_OR_DEPARTMENT_OTHER): Payer: BLUE CROSS/BLUE SHIELD | Admitting: Anesthesiology

## 2017-11-02 DIAGNOSIS — Z8673 Personal history of transient ischemic attack (TIA), and cerebral infarction without residual deficits: Secondary | ICD-10-CM | POA: Diagnosis not present

## 2017-11-02 DIAGNOSIS — E109 Type 1 diabetes mellitus without complications: Secondary | ICD-10-CM | POA: Diagnosis not present

## 2017-11-02 DIAGNOSIS — Z79899 Other long term (current) drug therapy: Secondary | ICD-10-CM | POA: Insufficient documentation

## 2017-11-02 DIAGNOSIS — Z794 Long term (current) use of insulin: Secondary | ICD-10-CM | POA: Diagnosis not present

## 2017-11-02 DIAGNOSIS — I10 Essential (primary) hypertension: Secondary | ICD-10-CM | POA: Diagnosis not present

## 2017-11-02 DIAGNOSIS — M65312 Trigger thumb, left thumb: Secondary | ICD-10-CM | POA: Insufficient documentation

## 2017-11-02 DIAGNOSIS — E108 Type 1 diabetes mellitus with unspecified complications: Secondary | ICD-10-CM | POA: Diagnosis not present

## 2017-11-02 DIAGNOSIS — Z87891 Personal history of nicotine dependence: Secondary | ICD-10-CM | POA: Diagnosis not present

## 2017-11-02 DIAGNOSIS — F419 Anxiety disorder, unspecified: Secondary | ICD-10-CM | POA: Insufficient documentation

## 2017-11-02 HISTORY — PX: TRIGGER FINGER RELEASE: SHX641

## 2017-11-02 HISTORY — DX: Atrial septal defect: Q21.1

## 2017-11-02 HISTORY — DX: Patent foramen ovale: Q21.12

## 2017-11-02 LAB — GLUCOSE, CAPILLARY
Glucose-Capillary: 190 mg/dL — ABNORMAL HIGH (ref 65–99)
Glucose-Capillary: 181 mg/dL — ABNORMAL HIGH (ref 65–99)

## 2017-11-02 SURGERY — RELEASE, A1 PULLEY, FOR TRIGGER FINGER
Anesthesia: Monitor Anesthesia Care | Site: Thumb | Laterality: Left

## 2017-11-02 MED ORDER — CEFAZOLIN SODIUM-DEXTROSE 2-4 GM/100ML-% IV SOLN
INTRAVENOUS | Status: AC
  Filled 2017-11-02: qty 100

## 2017-11-02 MED ORDER — FENTANYL CITRATE (PF) 100 MCG/2ML IJ SOLN: 25.0000 ug | INTRAMUSCULAR | Status: DC | PRN

## 2017-11-02 MED ORDER — OXYCODONE HCL 5 MG/5ML PO SOLN: 5.0000 mg | Freq: Once | ORAL | Status: DC | PRN

## 2017-11-02 MED ORDER — LIDOCAINE HCL 2 % IJ SOLN
INTRAMUSCULAR | Status: AC
  Filled 2017-11-02: qty 40

## 2017-11-02 MED ORDER — SCOPOLAMINE 1 MG/3DAYS TD PT72: 1.0000 | MEDICATED_PATCH | Freq: Once | TRANSDERMAL | Status: DC | PRN

## 2017-11-02 MED ORDER — MIDAZOLAM HCL 2 MG/2ML IJ SOLN
INTRAMUSCULAR | Status: AC
  Filled 2017-11-02: qty 2

## 2017-11-02 MED ORDER — BUPIVACAINE-EPINEPHRINE (PF) 0.5% -1:200000 IJ SOLN
INTRAMUSCULAR | Status: AC
  Filled 2017-11-02: qty 60

## 2017-11-02 MED ORDER — ONDANSETRON HCL 4 MG/2ML IJ SOLN: 4.0000 mg | Freq: Once | INTRAMUSCULAR | Status: DC | PRN

## 2017-11-02 MED ORDER — FENTANYL CITRATE (PF) 100 MCG/2ML IJ SOLN
INTRAMUSCULAR | Status: AC
  Filled 2017-11-02: qty 2

## 2017-11-02 MED ORDER — LIDOCAINE HCL 2 % IJ SOLN
INTRAMUSCULAR | Status: DC | PRN
Start: 2017-11-02 — End: 2017-11-02
  Administered 2017-11-02: 2.5 mL

## 2017-11-02 MED ORDER — LACTATED RINGERS IV SOLN
INTRAVENOUS | Status: DC
  Administered 2017-11-02: 07:00:00 via INTRAVENOUS

## 2017-11-02 MED ORDER — LIDOCAINE HCL (CARDIAC) 20 MG/ML IV SOLN
INTRAVENOUS | Status: DC | PRN
  Administered 2017-11-02: 60 mg via INTRAVENOUS

## 2017-11-02 MED ORDER — IBUPROFEN 200 MG PO TABS
600.0000 mg | ORAL_TABLET | Freq: Four times a day (QID) | ORAL | 0 refills | Status: DC
Start: 2017-11-02 — End: 2019-07-07

## 2017-11-02 MED ORDER — ACETAMINOPHEN 160 MG/5ML PO SOLN: 325.0000 mg | ORAL | Status: DC | PRN

## 2017-11-02 MED ORDER — BUPIVACAINE HCL (PF) 0.5 % IJ SOLN
INTRAMUSCULAR | Status: AC
  Filled 2017-11-02: qty 60

## 2017-11-02 MED ORDER — CEFAZOLIN SODIUM-DEXTROSE 2-4 GM/100ML-% IV SOLN
2.0000 g | INTRAVENOUS | Status: AC
  Administered 2017-11-02: 2 g via INTRAVENOUS

## 2017-11-02 MED ORDER — FENTANYL CITRATE (PF) 100 MCG/2ML IJ SOLN
50.0000 ug | INTRAMUSCULAR | Status: DC | PRN
  Administered 2017-11-02: 100 ug via INTRAVENOUS

## 2017-11-02 MED ORDER — KETOROLAC TROMETHAMINE 30 MG/ML IJ SOLN: 30.0000 mg | Freq: Once | INTRAMUSCULAR | Status: DC | PRN

## 2017-11-02 MED ORDER — PROPOFOL 500 MG/50ML IV EMUL
INTRAVENOUS | Status: DC | PRN
  Administered 2017-11-02: 100 ug/kg/min via INTRAVENOUS

## 2017-11-02 MED ORDER — OXYCODONE HCL 5 MG PO TABS: 5.0000 mg | ORAL_TABLET | Freq: Four times a day (QID) | ORAL | 0 refills | Status: DC | PRN

## 2017-11-02 MED ORDER — ONDANSETRON HCL 4 MG/2ML IJ SOLN
INTRAMUSCULAR | Status: DC | PRN
  Administered 2017-11-02: 4 mg via INTRAVENOUS

## 2017-11-02 MED ORDER — LACTATED RINGERS IV SOLN: INTRAVENOUS | Status: DC

## 2017-11-02 MED ORDER — BUPIVACAINE-EPINEPHRINE 0.5% -1:200000 IJ SOLN
INTRAMUSCULAR | Status: DC | PRN
  Administered 2017-11-02: 2.5 mL

## 2017-11-02 MED ORDER — ACETAMINOPHEN 325 MG PO TABS: 325.0000 mg | ORAL_TABLET | ORAL | Status: DC | PRN

## 2017-11-02 MED ORDER — MEPERIDINE HCL 25 MG/ML IJ SOLN: 6.2500 mg | INTRAMUSCULAR | Status: DC | PRN

## 2017-11-02 MED ORDER — ACETAMINOPHEN 325 MG PO TABS: 650.0000 mg | ORAL_TABLET | Freq: Four times a day (QID) | ORAL | Status: DC

## 2017-11-02 MED ORDER — OXYCODONE HCL 5 MG PO TABS: 5.0000 mg | ORAL_TABLET | Freq: Once | ORAL | Status: DC | PRN

## 2017-11-02 MED ORDER — BUPIVACAINE HCL (PF) 0.25 % IJ SOLN
INTRAMUSCULAR | Status: AC
  Filled 2017-11-02: qty 30

## 2017-11-02 MED ORDER — MIDAZOLAM HCL 2 MG/2ML IJ SOLN
1.0000 mg | INTRAMUSCULAR | Status: DC | PRN
  Administered 2017-11-02: 2 mg via INTRAVENOUS

## 2017-11-02 MED ORDER — LIDOCAINE HCL (PF) 1 % IJ SOLN
INTRAMUSCULAR | Status: AC
  Filled 2017-11-02: qty 60

## 2017-11-02 MED ORDER — EPINEPHRINE 30 MG/30ML IJ SOLN
INTRAMUSCULAR | Status: AC
  Filled 2017-11-02: qty 1

## 2017-11-02 SURGICAL SUPPLY — 36 items
BLADE SURG 15 STRL LF DISP TIS (BLADE) ×1 IMPLANT
BLADE SURG 15 STRL SS (BLADE) ×2
BNDG CMPR 9X4 STRL LF SNTH (GAUZE/BANDAGES/DRESSINGS) ×1
BNDG COHESIVE 1X5 TAN STRL LF (GAUZE/BANDAGES/DRESSINGS) ×2 IMPLANT
BNDG CONFORM 2 STRL LF (GAUZE/BANDAGES/DRESSINGS) ×2 IMPLANT
BNDG ESMARK 4X9 LF (GAUZE/BANDAGES/DRESSINGS) ×2 IMPLANT
CHLORAPREP W/TINT 26ML (MISCELLANEOUS) ×2 IMPLANT
COVER BACK TABLE 60X90IN (DRAPES) ×2 IMPLANT
COVER MAYO STAND STRL (DRAPES) ×2 IMPLANT
CUFF TOURNIQUET SINGLE 18IN (TOURNIQUET CUFF) ×2 IMPLANT
DRAPE EXTREMITY T 121X128X90 (DRAPE) ×2 IMPLANT
DRAPE SURG 17X23 STRL (DRAPES) ×2 IMPLANT
DRSG EMULSION OIL 3X3 NADH (GAUZE/BANDAGES/DRESSINGS) ×2 IMPLANT
GAUZE SPONGE 4X4 12PLY STRL LF (GAUZE/BANDAGES/DRESSINGS) ×2 IMPLANT
GLOVE BIO SURGEON STRL SZ7.5 (GLOVE) ×2 IMPLANT
GLOVE BIOGEL PI IND STRL 7.0 (GLOVE) ×2 IMPLANT
GLOVE BIOGEL PI IND STRL 8 (GLOVE) ×1 IMPLANT
GLOVE BIOGEL PI INDICATOR 7.0 (GLOVE) ×2
GLOVE BIOGEL PI INDICATOR 8 (GLOVE) ×1
GLOVE ECLIPSE 6.5 STRL STRAW (GLOVE) ×4 IMPLANT
GOWN STRL REUS W/ TWL LRG LVL3 (GOWN DISPOSABLE) ×2 IMPLANT
GOWN STRL REUS W/TWL LRG LVL3 (GOWN DISPOSABLE) ×4
GOWN STRL REUS W/TWL XL LVL3 (GOWN DISPOSABLE) ×2 IMPLANT
NDL SAFETY ECLIPSE 18X1.5 (NEEDLE) ×1 IMPLANT
NEEDLE HYPO 18GX1.5 SHARP (NEEDLE) ×1
NEEDLE HYPO 25X1 1.5 SAFETY (NEEDLE) ×2 IMPLANT
NS IRRIG 1000ML POUR BTL (IV SOLUTION) ×2 IMPLANT
PACK BASIN DAY SURGERY FS (CUSTOM PROCEDURE TRAY) ×2 IMPLANT
STOCKINETTE 6  STRL (DRAPES) ×1
STOCKINETTE 6 STRL (DRAPES) ×1 IMPLANT
SUT VICRYL RAPIDE 4/0 PS 2 (SUTURE) IMPLANT
SYR 10ML LL (SYRINGE) ×2 IMPLANT
SYR BULB 3OZ (MISCELLANEOUS) ×2 IMPLANT
TOWEL OR 17X24 6PK STRL BLUE (TOWEL DISPOSABLE) ×2 IMPLANT
TOWEL OR NON WOVEN STRL DISP B (DISPOSABLE) IMPLANT
UNDERPAD 30X30 (UNDERPADS AND DIAPERS) ×2 IMPLANT

## 2017-11-02 NOTE — Op Note (Signed)
11/02/2017  7:29 AM  PATIENT:  Natalie Ware  52 y.o. female  PRE-OPERATIVE DIAGNOSIS:  LEFT THUMB TRIGGER DIGIT M65.312  POST-OPERATIVE DIAGNOSIS:  Same  PROCEDURE:  Procedure(s): LEFT THUMB TRIGGER DIGIT RELEASE  SURGEON:  Surgeon(s): Milly Jakob, MD  PHYSICIAN ASSISTANT: Beatriz Stallion, OPA-C  ANESTHESIA:  Local / MAC  SPECIMENS:  None  DRAINS:   None  EBL:  less than 50 mL  PREOPERATIVE INDICATIONS:  [name] is a  52 y.o. female with a diagnosis of LEFT THUMB TRIGGER DIGIT M65.312 who failed conservative measures and elected for surgical management.    The risks benefits and alternatives were discussed with the patient preoperatively including but not limited to the risks of infection, bleeding, nerve injury, cardiopulmonary complications, the need for revision surgery, among others, and the patient verbalized understanding and consented to proceed.  OPERATIVE IMPLANTS: None  OPERATIVE FINDINGS: See Below  OPERATIVE PROCEDURE:  After receiving prophylactic antibiotics, the patient was escorted to the operative theatre and placed in a supine position.   A surgical "time-out" was performed during which the planned procedure, proposed operative site, and the correct patient identity were compared to the operative consent and agreement confirmed by the circulating nurse according to current facility policy.  Digital block was performed with a mixture of lidocaine and Marcaine, bearing epinephrine. Following application of a tourniquet to the operative extremity, the exposed skin was prepped with Chloraprep and draped in the usual sterile fashion.  The limb was exsanguinated with an Esmarch bandage and the tourniquet inflated to approximately 126mHg higher than systolic BP.  A transverse incision was made over the A1 pulley of the affected digit.  Subcutaneous taste tissues were dissected with blunt and spreading dissection to reveal an underlying flexor tendon sheath  and A1 pulley.  With the neurovascular structures protected, the A1 pulley was split in the midline under direct visualization with loupe assistance.  Some crossing bands proximal to the A1 pulley were also released.  The tendon was pulled into view, cleaned of thickened synovium and return to its bed.  The wound was irrigated, tourniquet released, and skin closed with 4-0 Vicryl Rapide.  A light dressing was applied and the patient was taken to the recovery room.  DISPOSITION: The patient will be discharged home today with typical instructions, returning in 10-15 days.

## 2017-11-02 NOTE — Transfer of Care (Signed)
Immediate Anesthesia Transfer of Care Note  Patient: Natalie Ware  Procedure(s) Performed: LEFT THUMB TRIGGER DIGIT RELEASE (Left Thumb)  Patient Location: PACU  Anesthesia Type:MAC  Level of Consciousness: awake, alert , oriented and patient cooperative  Airway & Oxygen Therapy: Patient Spontanous Breathing and Patient connected to face mask oxygen  Post-op Assessment: Report given to RN and Post -op Vital signs reviewed and stable  Post vital signs: Reviewed and stable  Last Vitals:  Vitals:   11/02/17 0659  BP: 134/68  Pulse: 87  Temp: 36.7 C    Last Pain:  Vitals:   11/02/17 0659  TempSrc: Oral         Complications: No apparent anesthesia complications

## 2017-11-02 NOTE — Discharge Instructions (Signed)
Discharge Instructions   You have a light dressing on your hand.  You may begin gentle motion of your fingers and hand immediately, but you should not do any heavy lifting or gripping.  Elevate your hand to reduce pain & swelling of the digits.  Ice over the operative site may be helpful to reduce pain & swelling.  DO NOT USE HEAT. Pain medicine has been prescribed for you.  Take 650 mg of Tylenol and 600 mg of Ibuprofen every 6 hours together for pain. Take the oxycodone additionally for severe breakthrough pain. Leave the dressing in place until the third day after your surgery and then remove it, leaving it open to air.  After the bandage has been removed you may shower, regularly washing the incision and letting the water run over it, but not submerging it (no swimming, soaking it in dishwater, etc.) You may drive a car when you are off of prescription pain medications and can safely control your vehicle with both hands. We will address whether therapy will be required or not when you return to the office. You may have already made your follow-up appointment when we completed your preop visit.  If not, please call our office today or the next business day to make your return appointment for 10-15 days after surgery.   Please call [phone] during normal business hours or [phone] after hours for any problems. Including the following:  - excessive redness of the incisions - drainage for more than 4 days - fever of more than 101.5 F  *Please note that pain medications will not be refilled after hours or on weekends.  WORK STATUS: Patient may return to work on Wednesday, Nov 04, 2017 without restrictions.   Post Anesthesia Home Care Instructions  Activity: Get plenty of rest for the remainder of the day. A responsible individual must stay with you for 24 hours following the procedure.  For the next 24 hours, DO NOT: -Drive a car -Paediatric nurse -Drink alcoholic  beverages -Take any medication unless instructed by your physician -Make any legal decisions or sign important papers.  Meals: Start with liquid foods such as gelatin or soup. Progress to regular foods as tolerated. Avoid greasy, spicy, heavy foods. If nausea and/or vomiting occur, drink only clear liquids until the nausea and/or vomiting subsides. Call your physician if vomiting continues.  Special Instructions/Symptoms: Your throat may feel dry or sore from the anesthesia or the breathing tube placed in your throat during surgery. If this causes discomfort, gargle with warm salt water. The discomfort should disappear within 24 hours.  If you had a scopolamine patch placed behind your ear for the management of post- operative nausea and/or vomiting:  1. The medication in the patch is effective for 72 hours, after which it should be removed.  Wrap patch in a tissue and discard in the trash. Wash hands thoroughly with soap and water. 2. You may remove the patch earlier than 72 hours if you experience unpleasant side effects which may include dry mouth, dizziness or visual disturbances. 3. Avoid touching the patch. Wash your hands with soap and water after contact with the patch.

## 2017-11-02 NOTE — Anesthesia Procedure Notes (Signed)
Procedure Name: MAC Date/Time: 11/02/2017 7:48 AM Performed by: Signe Colt, CRNA Pre-anesthesia Checklist: Patient identified, Emergency Drugs available, Suction available, Patient being monitored and Timeout performed Patient Re-evaluated:Patient Re-evaluated prior to induction Oxygen Delivery Method: Simple face mask

## 2017-11-02 NOTE — Anesthesia Postprocedure Evaluation (Signed)
Anesthesia Post Note  Patient: Natalie Ware  Procedure(s) Performed: LEFT THUMB TRIGGER DIGIT RELEASE (Left Thumb)     Patient location during evaluation: PACU Anesthesia Type: MAC Level of consciousness: awake Pain management: pain level controlled Vital Signs Assessment: post-procedure vital signs reviewed and stable Respiratory status: spontaneous breathing Cardiovascular status: stable Postop Assessment: no apparent nausea or vomiting Anesthetic complications: no    Last Vitals:  Vitals:   11/02/17 0815 11/02/17 0823  BP: 108/63 (!) 114/57  Pulse: 83 82  Resp: 18 16  Temp:    SpO2: 99% 98%    Last Pain:  Vitals:   11/02/17 0802  TempSrc:   PainSc: 0-No pain   Pain Goal:                 Micha Dosanjh JR,JOHN Garrison Michie

## 2017-11-02 NOTE — Interval H&P Note (Signed)
History and Physical Interval Note:  11/02/2017 7:28 AM  Natalie Ware  has presented today for surgery, with the diagnosis of LEFT THUMB TRIGGER DIGIT M65.312  The various methods of treatment have been discussed with the patient and family. After consideration of risks, benefits and other options for treatment, the patient has consented to  Procedure(s): LEFT THUMB TRIGGER DIGIT RELEASE (Left) as a surgical intervention .  The patient's history has been reviewed, patient examined, no change in status, stable for surgery.  I have reviewed the patient's chart and labs.  Questions were answered to the patient's satisfaction.     Jolyn Nap

## 2017-11-03 ENCOUNTER — Encounter (HOSPITAL_BASED_OUTPATIENT_CLINIC_OR_DEPARTMENT_OTHER): Payer: Self-pay | Admitting: Orthopedic Surgery

## 2017-11-06 DIAGNOSIS — E875 Hyperkalemia: Secondary | ICD-10-CM | POA: Diagnosis not present

## 2017-11-09 DIAGNOSIS — E875 Hyperkalemia: Secondary | ICD-10-CM | POA: Diagnosis not present

## 2017-11-09 DIAGNOSIS — F419 Anxiety disorder, unspecified: Secondary | ICD-10-CM | POA: Diagnosis not present

## 2017-11-09 DIAGNOSIS — Z6828 Body mass index (BMI) 28.0-28.9, adult: Secondary | ICD-10-CM | POA: Diagnosis not present

## 2017-11-09 DIAGNOSIS — F331 Major depressive disorder, recurrent, moderate: Secondary | ICD-10-CM | POA: Diagnosis not present

## 2017-11-19 DIAGNOSIS — Z794 Long term (current) use of insulin: Secondary | ICD-10-CM | POA: Diagnosis not present

## 2017-11-19 DIAGNOSIS — M65312 Trigger thumb, left thumb: Secondary | ICD-10-CM | POA: Diagnosis not present

## 2017-12-10 DIAGNOSIS — E109 Type 1 diabetes mellitus without complications: Secondary | ICD-10-CM | POA: Diagnosis not present

## 2017-12-10 DIAGNOSIS — Z6827 Body mass index (BMI) 27.0-27.9, adult: Secondary | ICD-10-CM | POA: Diagnosis not present

## 2017-12-10 DIAGNOSIS — F411 Generalized anxiety disorder: Secondary | ICD-10-CM | POA: Diagnosis not present

## 2017-12-10 DIAGNOSIS — I10 Essential (primary) hypertension: Secondary | ICD-10-CM | POA: Diagnosis not present

## 2018-01-03 DIAGNOSIS — F411 Generalized anxiety disorder: Secondary | ICD-10-CM | POA: Diagnosis not present

## 2018-01-08 ENCOUNTER — Other Ambulatory Visit: Payer: Self-pay

## 2018-01-08 ENCOUNTER — Encounter (HOSPITAL_COMMUNITY): Payer: Self-pay

## 2018-01-08 ENCOUNTER — Emergency Department (HOSPITAL_COMMUNITY)
Admission: EM | Admit: 2018-01-08 | Discharge: 2018-01-08 | Disposition: A | Discharge status: Home / Self Care | Payer: BLUE CROSS/BLUE SHIELD | Attending: Emergency Medicine | Admitting: Emergency Medicine

## 2018-01-08 ENCOUNTER — Emergency Department (HOSPITAL_COMMUNITY): Payer: BLUE CROSS/BLUE SHIELD

## 2018-01-08 DIAGNOSIS — I10 Essential (primary) hypertension: Secondary | ICD-10-CM | POA: Insufficient documentation

## 2018-01-08 DIAGNOSIS — Z87891 Personal history of nicotine dependence: Secondary | ICD-10-CM | POA: Insufficient documentation

## 2018-01-08 DIAGNOSIS — Z79899 Other long term (current) drug therapy: Secondary | ICD-10-CM | POA: Diagnosis not present

## 2018-01-08 DIAGNOSIS — K5900 Constipation, unspecified: Secondary | ICD-10-CM | POA: Diagnosis not present

## 2018-01-08 DIAGNOSIS — Z794 Long term (current) use of insulin: Secondary | ICD-10-CM | POA: Diagnosis not present

## 2018-01-08 DIAGNOSIS — K429 Umbilical hernia without obstruction or gangrene: Secondary | ICD-10-CM | POA: Insufficient documentation

## 2018-01-08 DIAGNOSIS — R1033 Periumbilical pain: Secondary | ICD-10-CM | POA: Diagnosis not present

## 2018-01-08 DIAGNOSIS — Z7982 Long term (current) use of aspirin: Secondary | ICD-10-CM | POA: Diagnosis not present

## 2018-01-08 DIAGNOSIS — K769 Liver disease, unspecified: Secondary | ICD-10-CM | POA: Diagnosis not present

## 2018-01-08 DIAGNOSIS — E109 Type 1 diabetes mellitus without complications: Secondary | ICD-10-CM | POA: Diagnosis not present

## 2018-01-08 LAB — COMPREHENSIVE METABOLIC PANEL
ALBUMIN: 4 g/dL (ref 3.5–5.0)
ALT: 40 U/L (ref 14–54)
ANION GAP: 11 (ref 5–15)
AST: 29 U/L (ref 15–41)
Alkaline Phosphatase: 72 U/L (ref 38–126)
BILIRUBIN TOTAL: 0.9 mg/dL (ref 0.3–1.2)
BUN: 15 mg/dL (ref 6–20)
CHLORIDE: 102 mmol/L (ref 101–111)
CO2: 25 mmol/L (ref 22–32)
Calcium: 8.8 mg/dL — ABNORMAL LOW (ref 8.9–10.3)
Creatinine, Ser: 0.7 mg/dL (ref 0.44–1.00)
GFR calc Af Amer: >60 mL/min (ref >60)
GLUCOSE: 145 mg/dL — AB (ref 65–99)
POTASSIUM: 3.9 mmol/L (ref 3.5–5.1)
Sodium: 138 mmol/L (ref 135–145)
TOTAL PROTEIN: 7.7 g/dL (ref 6.5–8.1)

## 2018-01-08 LAB — CBC
HEMATOCRIT: 42.1 % (ref 36.0–46.0)
HEMOGLOBIN: 14.2 g/dL (ref 12.0–15.0)
MCH: 31 pg (ref 26.0–34.0)
MCHC: 33.7 g/dL (ref 30.0–36.0)
MCV: 91.9 fL (ref 78.0–100.0)
Platelets: 277 10*3/uL (ref 150–400)
RBC: 4.58 MIL/uL (ref 3.87–5.11)
RDW: 13.9 % (ref 11.5–15.5)
WBC: 5.8 10*3/uL (ref 4.0–10.5)

## 2018-01-08 LAB — I-STAT BETA HCG BLOOD, ED (MC, WL, AP ONLY)

## 2018-01-08 LAB — URINALYSIS, ROUTINE W REFLEX MICROSCOPIC
Bilirubin Urine: NEGATIVE
Glucose, UA: 50 mg/dL — AB
Ketones, ur: NEGATIVE mg/dL
LEUKOCYTES UA: NEGATIVE
NITRITE: NEGATIVE
PROTEIN: NEGATIVE mg/dL
SPECIFIC GRAVITY, URINE: 1.003 — AB (ref 1.005–1.030)
pH: 6 (ref 5.0–8.0)

## 2018-01-08 LAB — LIPASE, BLOOD: LIPASE: 29 U/L (ref 11–51)

## 2018-01-08 MED ORDER — IOPAMIDOL (ISOVUE-300) INJECTION 61%
INTRAVENOUS | Status: AC
  Filled 2018-01-08: qty 100

## 2018-01-08 MED ORDER — IOHEXOL 300 MG/ML  SOLN
30.0000 mL | Freq: Once | INTRAMUSCULAR | Status: AC | PRN
  Administered 2018-01-08: 30 mL via ORAL

## 2018-01-08 MED ORDER — HYDROCODONE-ACETAMINOPHEN 5-325 MG PO TABS: 1.0000 | ORAL_TABLET | Freq: Four times a day (QID) | ORAL | 0 refills | Status: DC | PRN

## 2018-01-08 MED ORDER — SODIUM CHLORIDE 0.9 % IJ SOLN
INTRAMUSCULAR | Status: AC
  Filled 2018-01-08: qty 50

## 2018-01-08 MED ORDER — MORPHINE SULFATE (PF) 4 MG/ML IV SOLN
4.0000 mg | Freq: Once | INTRAVENOUS | Status: AC
  Administered 2018-01-08: 4 mg via INTRAVENOUS
  Filled 2018-01-08: qty 1

## 2018-01-08 MED ORDER — ONDANSETRON HCL 4 MG/2ML IJ SOLN
4.0000 mg | Freq: Once | INTRAMUSCULAR | Status: AC
  Administered 2018-01-08: 4 mg via INTRAVENOUS
  Filled 2018-01-08: qty 2

## 2018-01-08 MED ORDER — FENTANYL CITRATE (PF) 100 MCG/2ML IJ SOLN
50.0000 ug | Freq: Once | INTRAMUSCULAR | Status: AC
  Administered 2018-01-08: 50 ug via INTRAVENOUS
  Filled 2018-01-08: qty 2

## 2018-01-08 MED ORDER — IOPAMIDOL (ISOVUE-300) INJECTION 61%
100.0000 mL | Freq: Once | INTRAVENOUS | Status: AC | PRN
  Administered 2018-01-08: 100 mL via INTRAVENOUS

## 2018-01-08 NOTE — Discharge Instructions (Addendum)
Make sure to take laxatives to help you have bowel movements.  Wear abdominal binder.  Avoid straining.  Take pain medicine only as needed when the hernia comes out, apply ice pack, try to push it back in.  If unable to push it in, return to emergency department.  Follow-up with Dr. Kae Heller as scheduled.

## 2018-01-08 NOTE — ED Triage Notes (Signed)
patient has an umbilical hernia. patient states it started protruding yesterday. Patient states she has pushed it in several times. Patient states she saw her PCP today and was sent to the Ed. Patient also reports no BM in 2 days.

## 2018-01-08 NOTE — ED Provider Notes (Signed)
[facility] DEPT Provider Note   CSN: [id number] Arrival date & time: 01/08/18  1035     History   Chief Complaint Chief Complaint  Patient presents with  . Abdominal Pain    HPI Natalie Ware is a 52 y.o. female.  HPI Natalie Ware is a 52 y.o. female with history of anxiety, constipation, hypertension, insulin-dependent diabetes, presents to emergency department complaint of abdominal pain.  Patient states she has an umbilical hernia.  She states that hernia was repaired numerous years ago.  She states in the last several months, she has noticed the recurrence of the hernia.  She states yesterday the hernia "popped out."  She states she applied ice pack on it was able to push it back pain but she does not think it is pushed all the way and yet.  She reports severe tenderness around the umbilicus.  She states that she is unable to use the bathroom, but states it is mainly because it is painful for her to strain.  She states she went to her family doctor who told her to come here to have her hernia repaired.  She denies any nausea or vomiting.  She states she is not eating because of the pain.  No fever or chills.  No blood in her stool.  No urinary symptoms.  Past Medical History:  Diagnosis Date  . Anxiety   . Chronic headache   . Constipation   . Dyspnea   . Hypertension   . Insulin dependent diabetes mellitus (HCC)    insulin pump  . PFO (patent foramen ovale)    being followed by Dr Andrez Grime problems, no surgery  . Shingles   . TIA (transient ischemic attack)     Patient Active Problem List   Diagnosis Date Noted  . Multiple somatic complaints 01/12/2013  . Bronchitis 09/08/2011  . Screening for malignant neoplasm of the cervix 09/08/2011  . Routine gynecological examination 09/08/2011  . Chest pain 02/17/2011  . Back pain 02/17/2011  . Nausea 02/17/2011  . DIABETES MELLITUS, TYPE I 10/22/2010  . ANXIETY STATE, UNSPECIFIED 10/22/2010  .  PATENT FORAMEN OVALE 10/22/2010  . GALACTORRHEA NOT ASSOCIATED WITH CHILDBIRTH 04/25/2009  . CONSTIPATION 04/03/2009  . DYSPNEA 04/03/2009  . DM 03/30/2009  . SMOKER 03/30/2009  . HYPERTENSION 03/30/2009  . TIA 03/30/2009    Past Surgical History:  Procedure Laterality Date  . cervicle fusion    . FOREIGN BODY REMOVAL     right index finger  . HERNIA REPAIR    . TRIGGER FINGER RELEASE Right   . TRIGGER FINGER RELEASE Left 11/02/2017   Procedure: LEFT THUMB TRIGGER DIGIT RELEASE;  Surgeon: Milly Jakob, MD;  Location: [facility];  Service: Orthopedics;  Laterality: Left;     OB History   None      Home Medications    Prior to Admission medications   Medication Sig Start Date End Date Taking? Authorizing Provider  acetaminophen (TYLENOL) 325 MG tablet Take 2 tablets (650 mg total) by mouth every 6 (six) hours. 11/02/17   Milly Jakob, MD  ALPRAZolam Duanne Moron) 0.5 MG tablet Take 0.5 mg by mouth daily as needed for anxiety.  12/26/15   [provider]  aspirin 81 MG tablet Take 162 mg by mouth daily.     [provider]  cyclobenzaprine (FLEXERIL) 10 MG tablet Take 1 tablet (10 mg total) by mouth 2 (two) times daily as needed for muscle spasms. 03/27/17  Domenic Moras, PA-C  escitalopram (LEXAPRO) 20 MG tablet Take 20 mg by mouth daily. 11/23/15   [provider]  ibuprofen (ADVIL) 200 MG tablet Take 3 tablets (600 mg total) by mouth every 6 (six) hours. 11/02/17   Milly Jakob, MD  losartan (COZAAR) 50 MG tablet Take 50 mg by mouth daily. 01/23/17   [provider]  NOVOLOG 100 UNIT/ML injection 25-30 Units. Inject into skin as directed VIA Pump 06/20/15   [provider]  oxyCODONE (ROXICODONE) 5 MG immediate release tablet Take 1 tablet (5 mg total) by mouth every 6 (six) hours as needed for breakthrough pain. 11/02/17   Milly Jakob, MD    Family History Family History  Problem Relation Age of Onset  . Colon  cancer Maternal Grandfather   . Cancer Maternal Grandfather        colon  . Diabetes Mother   . Ovarian cancer Paternal Aunt   . Cancer Paternal Aunt        ovarian    Social History Social History   Tobacco Use  . Smoking status: Former Smoker    Last attempt to quit: 09/08/2008    Years since quitting: 9.3  . Smokeless tobacco: Never Used  Substance Use Topics  . Alcohol use: Yes    Comment: social  . Drug use: No     Allergies   Insulin glargine; Lubiprostone; and Metoclopramide hcl   Review of Systems Review of Systems  Constitutional: Negative for chills and fever.  Respiratory: Negative for cough, chest tightness and shortness of breath.   Cardiovascular: Negative for chest pain, palpitations and leg swelling.  Gastrointestinal: Positive for abdominal pain and constipation. Negative for diarrhea, nausea and vomiting.  Genitourinary: Negative for dysuria, flank pain, pelvic pain, vaginal bleeding, vaginal discharge and vaginal pain.  Musculoskeletal: Negative for arthralgias, myalgias, neck pain and neck stiffness.  Skin: Negative for rash.  Neurological: Negative for dizziness, weakness and headaches.  All other systems reviewed and are negative.    Physical Exam Updated Vital Signs BP (!) 141/85 (BP Location: Left Arm)   Pulse 97   Temp 97.9 F (36.6 C) (Oral)   Resp 16   Ht 5\' 2"  (1.575 m)   Wt 74.8 kg (165 lb)   SpO2 100%   BMI 30.18 kg/m   Physical Exam  Constitutional: She appears well-developed and well-nourished. No distress.  HENT:  Head: Normocephalic.  Eyes: Conjunctivae are normal.  Neck: Neck supple.  Cardiovascular: Normal rate, regular rhythm and normal heart sounds.  Pulmonary/Chest: Effort normal and breath sounds normal. No respiratory distress. She has no wheezes. She has no rales.  Abdominal: Soft. Bowel sounds are normal. She exhibits no distension. There is tenderness. There is no rebound.  Tenderness to palpation around  umbilicus.  Unable to compress deeply due to the pain, unable to feel the hernia  Musculoskeletal: She exhibits no edema.  Neurological: She is alert.  Skin: Skin is warm and dry.  Psychiatric: She has a normal mood and affect. Her behavior is normal.  Nursing note and vitals reviewed.    ED Treatments / Results  Labs (all labs ordered are listed, but only abnormal results are displayed) Labs Reviewed  COMPREHENSIVE METABOLIC PANEL - Abnormal; Notable for the following components:      Result Value   Glucose, Bld 145 (*)    Calcium 8.8 (*)    All other components within normal limits  URINALYSIS, ROUTINE W REFLEX MICROSCOPIC - Abnormal; Notable for the  following components:   Color, Urine STRAW (*)    Specific Gravity, Urine 1.003 (*)    Glucose, UA 50 (*)    Hgb urine dipstick LARGE (*)    Bacteria, UA RARE (*)    All other components within normal limits  LIPASE, BLOOD  CBC  I-STAT BETA HCG BLOOD, ED ([facility], [facility], [facility] ONLY)    EKG None  Radiology Ct Abdomen Pelvis W Contrast  Result Date: 01/08/2018 CLINICAL DATA:  Umbilical hernia. EXAM: CT ABDOMEN AND PELVIS WITH CONTRAST TECHNIQUE: Multidetector CT imaging of the abdomen and pelvis was performed using the standard protocol following bolus administration of intravenous contrast. CONTRAST:  179mL ISOVUE-300 IOPAMIDOL (ISOVUE-300) INJECTION 61% COMPARISON:  CT 06/28/2015 FINDINGS: Lower chest: Lung bases are clear. Hepatobiliary: A small hyperenhancing lesion in the lateral aspect of the LEFT hepatic lobe is increased in size slowly from 2011. Lesion measures 15 mm x 12 mm compared to 6 x 6 mm. No biliary duct dilatation. Gallbladder normal. Pancreas: Pancreas is normal. No ductal dilatation. No pancreatic inflammation. Spleen: Normal spleen Adrenals/urinary tract: Adrenal glands are normal. Simple fluid attenuation lesion of the RIGHT kidney measures 2.6 cm. Mild increased in size from 2.4 mm 2016. Ureters and bladder normal.  Stomach/Bowel: Stomach, small bowel, appendix, and cecum are normal. The colon and rectosigmoid colon are normal. Vascular/Lymphatic: Abdominal aorta is normal caliber. No periportal or retroperitoneal adenopathy. No pelvic adenopathy. Reproductive: Uterus and ovaries normal.  A tampon in the vagina Other: Small umbilical hernia is fat filled and increased in size from 2016. Hernia sac measures 30 mm in diameter increased from 20 mm. The hernia mouth along the linea alba measures 12 mm (image 43/3). No inguinal hernia Musculoskeletal: No aggressive osseous lesion. IMPRESSION: 1. Interval increase in size fat filled umbilical hernia. 2. Interval increase in size of enhancing lesion LEFT hepatic lobe compared to CT of 2011 is most consistent with a benign lesion and favored a hemangioma. 3. Mild growth of benign appearing cystic lesion the RIGHT kidney. Electronically Signed   By: Suzy Bouchard M.D.   On: 01/08/2018 14:58    Procedures Procedures (including critical care time)  Medications Ordered in ED Medications  morphine 4 MG/ML injection 4 mg (has no administration in time range)  ondansetron (ZOFRAN) injection 4 mg (has no administration in time range)     Initial Impression / Assessment and Plan / ED Course  I have reviewed the triage vital signs and the nursing notes.  Pertinent labs & imaging results that were available during my care of the patient were reviewed by me and considered in my medical decision making (see chart for details).     Patient with possible incarcerated hernia.  Will get labs and CT abdomen pelvis.  Will order pain medication and try to see if I can repeat exam to feel for possible strangulated hernia.   CT scan showing fat-containing umbilical hernia measuring at 30 mm.  Patient is requesting to see a general surgery, she is requesting to have his hernia repaired today.  I had a long discussion with her spleen to her that this is not an emergent finding and I  am doubtful that they will take her to the OR today.  I did agree to speak with general surgeon.  Take her to the OR today, however they did schedule her for a spoke with surgery, they will not follow-up appointment next week.  Patient has an appointment scheduled at 11 AM on Thursday with Dr.  Connor.  Patient is agreeable to this plan, she requested pain medicine for when the hernia comes out and she has to pushes it back in.  I will give her 15 tabs of Vicodin.  We also discussed no straining, laxatives, abdominal binder.  Return precautions discussed.  Vitals:   01/08/18 1230 01/08/18 1300 01/08/18 1330 01/08/18 1335  BP: 111/64 129/75 93/78 127/75  Pulse: 70 63 72 77  Resp:    18  Temp:    98.3 F (36.8 C)  TempSrc:    Oral  SpO2: 98% 99% 98% 98%  Weight:      Height:         Final Clinical Impressions(s) / ED Diagnoses   Final diagnoses:  Umbilical hernia without obstruction and without gangrene    ED Discharge Orders        Ordered    HYDROcodone-acetaminophen (NORCO) 5-325 MG tablet  Every 6 hours PRN     01/08/18 1553       Jeannett Senior, PA-C 01/08/18 1556    Lacretia Leigh, MD 01/10/18 2315

## 2018-01-09 DIAGNOSIS — F411 Generalized anxiety disorder: Secondary | ICD-10-CM | POA: Diagnosis not present

## 2018-01-13 DIAGNOSIS — F411 Generalized anxiety disorder: Secondary | ICD-10-CM | POA: Diagnosis not present

## 2018-01-14 ENCOUNTER — Ambulatory Visit: Payer: Self-pay | Admitting: Surgery

## 2018-01-14 DIAGNOSIS — K429 Umbilical hernia without obstruction or gangrene: Secondary | ICD-10-CM | POA: Diagnosis not present

## 2018-01-14 NOTE — H&P (Signed)
Surgical H&P  CC: recurrent umbilical hernia  HPI: this is a very pleasant 52 year old woman who presents with a recurrent umbilical hernia.  She had it fixed apparently under local anesthetic in his office at [facility] 15 years ago after that she gained and lost weight, had to do a lot of heavy lifting etc.  In theinterim, the hernia has recurred.  It is intermittently painful for her.  It is only partially reducible. She went to the emergency room last week due to pain and swelling at the site.  She had a CT scan that demonstrates an 11 mm defect with incarcerated fat.  Allergies  Allergen Reactions  . Insulin Glargine     Rash   . Lubiprostone     Built up water in RIght eye and could not see  . Metoclopramide Hcl     lactating    Past Medical History:  Diagnosis Date  . Anxiety   . Chronic headache   . Constipation   . Dyspnea   . Hypertension   . Insulin dependent diabetes mellitus (HCC)    insulin pump  . PFO (patent foramen ovale)    being followed by Dr Andrez Grime problems, no surgery  . Shingles   . TIA (transient ischemic attack)     Past Surgical History:  Procedure Laterality Date  . cervicle fusion    . FOREIGN BODY REMOVAL     right index finger  . HERNIA REPAIR    . TRIGGER FINGER RELEASE Right   . TRIGGER FINGER RELEASE Left 11/02/2017   Procedure: LEFT THUMB TRIGGER DIGIT RELEASE;  Surgeon: Milly Jakob, MD;  Location: [place];  Service: Orthopedics;  Laterality: Left;    Family History  Problem Relation Age of Onset  . Colon cancer Maternal Grandfather   . Cancer Maternal Grandfather        colon  . Diabetes Mother   . Ovarian cancer Paternal Aunt   . Cancer Paternal Aunt        ovarian    Social History   Socioeconomic History  . Marital status: Single    Spouse name: Not on file  . Number of children: 0  . Years of education: Not on file  . Highest education level: Not on file  Occupational History  . Occupation: Engineer, technical sales  Social Needs  . Financial resource strain: Not on file  . Food insecurity:    Worry: Not on file    Inability: Not on file  . Transportation needs:    Medical: Not on file    Non-medical: Not on file  Tobacco Use  . Smoking status: Former Smoker    Last attempt to quit: 09/08/2008    Years since quitting: 9.3  . Smokeless tobacco: Never Used  Substance and Sexual Activity  . Alcohol use: Yes    Comment: social  . Drug use: No  . Sexual activity: Never  Lifestyle  . Physical activity:    Days per week: Not on file    Minutes per session: Not on file  . Stress: Not on file  Relationships  . Social connections:    Talks on phone: Not on file    Gets together: Not on file    Attends religious service: Not on file    Active member of club or organization: Not on file    Attends meetings of clubs or organizations: Not on file    Relationship status: Not on file  Other Topics Concern  .  Not on file  Social History Narrative   2-3 cups caffeine daily    Current Outpatient Medications on File Prior to Visit  Medication Sig Dispense Refill  . acetaminophen (TYLENOL) 325 MG tablet Take 2 tablets (650 mg total) by mouth every 6 (six) hours. (Patient not taking: Reported on 01/08/2018)    . acetaminophen (TYLENOL) 500 MG tablet Take 1,000 mg by mouth every 6 (six) hours as needed for moderate pain.    Marland Kitchen ALPRAZolam (XANAX) 0.5 MG tablet Take 0.5 mg by mouth daily as needed for anxiety.   0  . Black Cohosh 80 MG CAPS Take 80 mg by mouth daily.    . Black Cohosh-SoyIsoflav-Magnol (ESTROVEN MENOPAUSE RELIEF PO) Take 1 tablet by mouth daily.    . Continuous Blood Gluc Transmit (DEXCOM G6 TRANSMITTER) MISC 1 each by Does not apply route daily.    . cyclobenzaprine (FLEXERIL) 10 MG tablet Take 1 tablet (10 mg total) by mouth 2 (two) times daily as needed for muscle spasms. (Patient not taking: Reported on 01/08/2018) 20 tablet 0  . escitalopram (LEXAPRO) 20 MG tablet Take 20 mg by  mouth daily.  0  . HYDROcodone-acetaminophen (NORCO) 5-325 MG tablet Take 1 tablet by mouth every 6 (six) hours as needed for moderate pain. 15 tablet 0  . ibuprofen (ADVIL) 200 MG tablet Take 3 tablets (600 mg total) by mouth every 6 (six) hours. (Patient not taking: Reported on 01/08/2018)  0  . Insulin Human (INSULIN PUMP) SOLN Inject into the skin. Novolog    . losartan (COZAAR) 100 MG tablet Take 100 mg by mouth daily.    Marland Kitchen losartan (COZAAR) 50 MG tablet Take 50 mg by mouth daily.  0  . montelukast (SINGULAIR) 10 MG tablet Take 10 mg by mouth at bedtime.    . naproxen sodium (ALEVE) 220 MG tablet Take 220 mg by mouth 2 (two) times daily as needed (pain).    . NOVOLOG 100 UNIT/ML injection 25-30 Units. Inject into skin as directed VIA Pump  1  . ondansetron (ZOFRAN) 4 MG tablet Take 4 mg by mouth every 8 (eight) hours as needed for nausea or vomiting.    Marland Kitchen oxyCODONE (ROXICODONE) 5 MG immediate release tablet Take 1 tablet (5 mg total) by mouth every 6 (six) hours as needed for breakthrough pain. (Patient not taking: Reported on 01/08/2018) 10 tablet 0   No current facility-administered medications on file prior to visit.     Review of Systems: a complete, 10pt review of systems was completed with pertinent positives and negatives as documented in the HPI  Physical Exam: There were no vitals filed for this visit. Gen: A&Ox3, no distress  Head: normocephalic, atraumatic Eyes: extraocular motions intact, anicteric.  Neck: supple without mass or thyromegaly Chest: unlabored respirations, symmetrical air entry, clear bilaterally   Cardiovascular: RRR with palpable distal pulses, no pedal edema Abdomen: soft, nondistended, nontender. Chronically incarcerated umbilical hernia.  Well-healed curvilinear scar superior to the umbilicus.  Extremities: warm, without edema, no deformities  Neuro: grossly intact Psych: appropriate mood and affect, normal insight  Skin: warm and dry   CBC Latest Ref  Rng & Units 01/08/2018 03/27/2017 03/27/2017  WBC 4.0 - 10.5 K/uL 5.8 - 11.9(H)  Hemoglobin 12.0 - 15.0 g/dL 14.2 13.9 12.9  Hematocrit 36.0 - 46.0 % 42.1 41.0 37.9  Platelets 150 - 400 K/uL 277 - 276    CMP Latest Ref Rng & Units 01/08/2018 10/30/2017 03/27/2017  Glucose 65 - 99 mg/dL 145(H)  102(H) 213(H)  BUN 6 - 20 mg/dL 15 11 11   Creatinine 0.44 - 1.00 mg/dL 0.70 0.78 0.90  Sodium 135 - 145 mmol/L 138 139 137  Potassium 3.5 - 5.1 mmol/L 3.9 5.4(H) 4.0  Chloride 101 - 111 mmol/L 102 103 100(L)  CO2 22 - 32 mmol/L 25 25 -  Calcium 8.9 - 10.3 mg/dL 8.8(L) 9.2 -  Total Protein 6.5 - 8.1 g/dL 7.7 - -  Total Bilirubin 0.3 - 1.2 mg/dL 0.9 - -  Alkaline Phos 38 - 126 U/L 72 - -  AST 15 - 41 U/L 29 - -  ALT 14 - 54 U/L 40 - -    Lab Results  Component Value Date   INR 1.0 07/25/2008    Imaging: No results found.   A/P: recurrent umbilical hernia.  I recommended open repair possibly with use of mesh.  I discussed with her the procedure in detail including the risks of bleeding, infection, pain, scarring, intra-abdominal injury, hernia recurrence, as well as general risks of blood clots, heart attack, pneumonia, stroke, etc.  Questions were welcomed and answered.  She desires to proceed with surgery.   Romana Juniper, MD [facility] Surgery, [place] Pager [phone]

## 2018-01-14 NOTE — H&P (View-Only) (Signed)
Surgical H&P  CC: recurrent umbilical hernia  HPI: this is a very pleasant 52 year old woman who presents with a recurrent umbilical hernia.  She had it fixed apparently under local anesthetic in his office at [facility] 15 years ago after that she gained and lost weight, had to do a lot of heavy lifting etc.  In theinterim, the hernia has recurred.  It is intermittently painful for her.  It is only partially reducible. She went to the emergency room last week due to pain and swelling at the site.  She had a CT scan that demonstrates an 11 mm defect with incarcerated fat.  Allergies  Allergen Reactions  . Insulin Glargine     Rash   . Lubiprostone     Built up water in RIght eye and could not see  . Metoclopramide Hcl     lactating    Past Medical History:  Diagnosis Date  . Anxiety   . Chronic headache   . Constipation   . Dyspnea   . Hypertension   . Insulin dependent diabetes mellitus (HCC)    insulin pump  . PFO (patent foramen ovale)    being followed by Dr Andrez Grime problems, no surgery  . Shingles   . TIA (transient ischemic attack)     Past Surgical History:  Procedure Laterality Date  . cervicle fusion    . FOREIGN BODY REMOVAL     right index finger  . HERNIA REPAIR    . TRIGGER FINGER RELEASE Right   . TRIGGER FINGER RELEASE Left 11/02/2017   Procedure: LEFT THUMB TRIGGER DIGIT RELEASE;  Surgeon: Milly Jakob, MD;  Location: [place];  Service: Orthopedics;  Laterality: Left;    Family History  Problem Relation Age of Onset  . Colon cancer Maternal Grandfather   . Cancer Maternal Grandfather        colon  . Diabetes Mother   . Ovarian cancer Paternal Aunt   . Cancer Paternal Aunt        ovarian    Social History   Socioeconomic History  . Marital status: Single    Spouse name: Not on file  . Number of children: 0  . Years of education: Not on file  . Highest education level: Not on file  Occupational History  . Occupation: Engineer, technical sales  Social Needs  . Financial resource strain: Not on file  . Food insecurity:    Worry: Not on file    Inability: Not on file  . Transportation needs:    Medical: Not on file    Non-medical: Not on file  Tobacco Use  . Smoking status: Former Smoker    Last attempt to quit: 09/08/2008    Years since quitting: 9.3  . Smokeless tobacco: Never Used  Substance and Sexual Activity  . Alcohol use: Yes    Comment: social  . Drug use: No  . Sexual activity: Never  Lifestyle  . Physical activity:    Days per week: Not on file    Minutes per session: Not on file  . Stress: Not on file  Relationships  . Social connections:    Talks on phone: Not on file    Gets together: Not on file    Attends religious service: Not on file    Active member of club or organization: Not on file    Attends meetings of clubs or organizations: Not on file    Relationship status: Not on file  Other Topics Concern  .  Not on file  Social History Narrative   2-3 cups caffeine daily    Current Outpatient Medications on File Prior to Visit  Medication Sig Dispense Refill  . acetaminophen (TYLENOL) 325 MG tablet Take 2 tablets (650 mg total) by mouth every 6 (six) hours. (Patient not taking: Reported on 01/08/2018)    . acetaminophen (TYLENOL) 500 MG tablet Take 1,000 mg by mouth every 6 (six) hours as needed for moderate pain.    Marland Kitchen ALPRAZolam (XANAX) 0.5 MG tablet Take 0.5 mg by mouth daily as needed for anxiety.   0  . Black Cohosh 80 MG CAPS Take 80 mg by mouth daily.    . Black Cohosh-SoyIsoflav-Magnol (ESTROVEN MENOPAUSE RELIEF PO) Take 1 tablet by mouth daily.    . Continuous Blood Gluc Transmit (DEXCOM G6 TRANSMITTER) MISC 1 each by Does not apply route daily.    . cyclobenzaprine (FLEXERIL) 10 MG tablet Take 1 tablet (10 mg total) by mouth 2 (two) times daily as needed for muscle spasms. (Patient not taking: Reported on 01/08/2018) 20 tablet 0  . escitalopram (LEXAPRO) 20 MG tablet Take 20 mg by  mouth daily.  0  . HYDROcodone-acetaminophen (NORCO) 5-325 MG tablet Take 1 tablet by mouth every 6 (six) hours as needed for moderate pain. 15 tablet 0  . ibuprofen (ADVIL) 200 MG tablet Take 3 tablets (600 mg total) by mouth every 6 (six) hours. (Patient not taking: Reported on 01/08/2018)  0  . Insulin Human (INSULIN PUMP) SOLN Inject into the skin. Novolog    . losartan (COZAAR) 100 MG tablet Take 100 mg by mouth daily.    Marland Kitchen losartan (COZAAR) 50 MG tablet Take 50 mg by mouth daily.  0  . montelukast (SINGULAIR) 10 MG tablet Take 10 mg by mouth at bedtime.    . naproxen sodium (ALEVE) 220 MG tablet Take 220 mg by mouth 2 (two) times daily as needed (pain).    . NOVOLOG 100 UNIT/ML injection 25-30 Units. Inject into skin as directed VIA Pump  1  . ondansetron (ZOFRAN) 4 MG tablet Take 4 mg by mouth every 8 (eight) hours as needed for nausea or vomiting.    Marland Kitchen oxyCODONE (ROXICODONE) 5 MG immediate release tablet Take 1 tablet (5 mg total) by mouth every 6 (six) hours as needed for breakthrough pain. (Patient not taking: Reported on 01/08/2018) 10 tablet 0   No current facility-administered medications on file prior to visit.     Review of Systems: a complete, 10pt review of systems was completed with pertinent positives and negatives as documented in the HPI  Physical Exam: There were no vitals filed for this visit. Gen: A&Ox3, no distress  Head: normocephalic, atraumatic Eyes: extraocular motions intact, anicteric.  Neck: supple without mass or thyromegaly Chest: unlabored respirations, symmetrical air entry, clear bilaterally   Cardiovascular: RRR with palpable distal pulses, no pedal edema Abdomen: soft, nondistended, nontender. Chronically incarcerated umbilical hernia.  Well-healed curvilinear scar superior to the umbilicus.  Extremities: warm, without edema, no deformities  Neuro: grossly intact Psych: appropriate mood and affect, normal insight  Skin: warm and dry   CBC Latest Ref  Rng & Units 01/08/2018 03/27/2017 03/27/2017  WBC 4.0 - 10.5 K/uL 5.8 - 11.9(H)  Hemoglobin 12.0 - 15.0 g/dL 14.2 13.9 12.9  Hematocrit 36.0 - 46.0 % 42.1 41.0 37.9  Platelets 150 - 400 K/uL 277 - 276    CMP Latest Ref Rng & Units 01/08/2018 10/30/2017 03/27/2017  Glucose 65 - 99 mg/dL 145(H)  102(H) 213(H)  BUN 6 - 20 mg/dL 15 11 11   Creatinine 0.44 - 1.00 mg/dL 0.70 0.78 0.90  Sodium 135 - 145 mmol/L 138 139 137  Potassium 3.5 - 5.1 mmol/L 3.9 5.4(H) 4.0  Chloride 101 - 111 mmol/L 102 103 100(L)  CO2 22 - 32 mmol/L 25 25 -  Calcium 8.9 - 10.3 mg/dL 8.8(L) 9.2 -  Total Protein 6.5 - 8.1 g/dL 7.7 - -  Total Bilirubin 0.3 - 1.2 mg/dL 0.9 - -  Alkaline Phos 38 - 126 U/L 72 - -  AST 15 - 41 U/L 29 - -  ALT 14 - 54 U/L 40 - -    Lab Results  Component Value Date   INR 1.0 07/25/2008    Imaging: No results found.   A/P: recurrent umbilical hernia.  I recommended open repair possibly with use of mesh.  I discussed with her the procedure in detail including the risks of bleeding, infection, pain, scarring, intra-abdominal injury, hernia recurrence, as well as general risks of blood clots, heart attack, pneumonia, stroke, etc.  Questions were welcomed and answered.  She desires to proceed with surgery.   Romana Juniper, MD [facility] Surgery, [place] Pager [phone]

## 2018-01-21 NOTE — Patient Instructions (Addendum)
CHENITA RUDA  01/21/2018   Your procedure is scheduled on: 01-26-18  Report to [facility] Main  Entrance              Report to admitting at     0730 AM    Call this number if you have problems the morning of surgery [phone]    Remember: Do not eat food or drink liquids :After Midnight.     Take these medicines the morning of surgery with A SIP OF WATER: lexapreo, xanax and hydrocodone if needed                                You may not have any metal on your body including hair pins and              piercings  Do not wear jewelry, make-up, lotions, powders or perfumes, deodorant             Do not wear nail polish.  Do not shave  48 hours prior to surgery.     Do not bring valuables to the hospital. [place].  Contacts, dentures or bridgework may not be worn into surgery.  Leave suitcase in the car. After surgery it may be brought to your room.     Patients discharged the day of surgery will not be allowed to drive home.  Name and phone number of your driver:  Special Instructions: N/A              Please read over the following fact sheets you were given: _____________________________________________________________________             [facility] - Preparing for Surgery Before surgery, you can play an important role.  Because skin is not sterile, your skin needs to be as free of germs as possible.  You can reduce the number of germs on your skin by washing with CHG (chlorahexidine gluconate) soap before surgery.  CHG is an antiseptic cleaner which kills germs and bonds with the skin to continue killing germs even after washing. Please DO NOT use if you have an allergy to CHG or antibacterial soaps.  If your skin becomes reddened/irritated stop using the CHG and inform your nurse when you arrive at Short Stay. Do not shave (including legs and underarms) for at least 48 hours prior to the first  CHG shower.  You may shave your face/neck. Please follow these instructions carefully:  1.  Shower with CHG Soap the night before surgery and the  morning of Surgery.  2.  If you choose to wash your hair, wash your hair first as usual with your  normal  shampoo.  3.  After you shampoo, rinse your hair and body thoroughly to remove the  shampoo.                           4.  Use CHG as you would any other liquid soap.  You can apply chg directly  to the skin and wash                       Gently with a scrungie or clean washcloth.  5.  Apply the CHG Soap to your body ONLY FROM THE NECK DOWN.   Do not use on face/ open                           Wound or open sores. Avoid contact with eyes, ears mouth and genitals (private parts).                       Wash face,  Genitals (private parts) with your normal soap.             6.  Wash thoroughly, paying special attention to the area where your surgery  will be performed.  7.  Thoroughly rinse your body with warm water from the neck down.  8.  DO NOT shower/wash with your normal soap after using and rinsing off  the CHG Soap.                9.  Pat yourself dry with a clean towel.            10.  Wear clean pajamas.            11.  Place clean sheets on your bed the night of your first shower and do not  sleep with pets. Day of Surgery : Do not apply any lotions/deodorants the morning of surgery.  Please wear clean clothes to the hospital/surgery center.  FAILURE TO FOLLOW THESE INSTRUCTIONS MAY RESULT IN THE CANCELLATION OF YOUR SURGERY PATIENT SIGNATURE_________________________________  NURSE SIGNATURE__________________________________  ________________________________________________________________________ How to Manage Your Diabetes Before and After Surgery  Why is it important to control my blood sugar before and after surgery? . Improving blood sugar levels before and after surgery helps healing and can limit problems. . A way of  improving blood sugar control is eating a healthy diet by: o  Eating less sugar and carbohydrates o  Increasing activity/exercise o  Talking with your doctor about reaching your blood sugar goals . High blood sugars (greater than 180 mg/dL) can raise your risk of infections and slow your recovery, so you will need to focus on controlling your diabetes during the weeks before surgery. . Make sure that the doctor who takes care of your diabetes knows about your planned surgery including the date and location.  How do I manage my blood sugar before surgery? . Check your blood sugar at least 4 times a day, starting 2 days before surgery, to make sure that the level is not too high or low. o Check your blood sugar the morning of your surgery when you wake up and every 2 hours until you get to the Short Stay unit. . If your blood sugar is less than 70 mg/dL, you will need to treat for low blood sugar: o Do not take insulin. o Treat a low blood sugar (less than 70 mg/dL) with  cup of clear juice (cranberry or apple), 4 glucose tablets, OR glucose gel. o Recheck blood sugar in 15 minutes after treatment (to make sure it is greater than 70 mg/dL). If your blood sugar is not greater than 70 mg/dL on recheck, call [phone] for further instructions. . Report your blood sugar to the short stay nurse when you get to Short Stay.  . If you are admitted to the hospital after surgery: o Your blood sugar will be checked by the staff and you will probably be given insulin after surgery (instead of oral diabetes medicines) to  make sure you have good blood sugar levels. o The goal for blood sugar control after surgery is 80-180 mg/dL.   WHAT DO I DO ABOUT MY DIABETES MEDICATION?  Marland Kitchen Do not take oral diabetes medicines (pills) the morning of surgery. .  . The day of surgery, do not take other diabetes injectables, including Byetta (exenatide), Bydureon (exenatide ER), Victoza (liraglutide), or Trulicity  (dulaglutide).  . If your CBG is greater than 220 mg/dL, you may take  of your sliding scale  . (correction) dose of insulin.    For patients with insulin pumps: Contact your diabetes doctor for specific instructions before surgery. Decrease basal rates by 20% at midnight the night before your surgery. Note that if your surgery is planned to be longer than 2 hours, your insulin pump will be removed and intravenous (IV) insulin will be started and managed by the nurses and the anesthesiologist. You will be able to restart your insulin pump once you are awake and able to manage it.  Make sure to bring insulin pump supplies to the hospital with you in case the  site needs to be changed.  Patient Signature:  Date:   Nurse Signature:  Date:   Reviewed and Endorsed by [facility] Patient Education Committee, August 2015

## 2018-01-22 ENCOUNTER — Encounter (HOSPITAL_COMMUNITY)
Admission: RE | Admit: 2018-01-22 | Discharge: 2018-01-22 | Disposition: A | Discharge status: Home / Self Care | Payer: BLUE CROSS/BLUE SHIELD | Source: Ambulatory Visit | Attending: Surgery | Admitting: Surgery

## 2018-01-22 ENCOUNTER — Encounter (HOSPITAL_COMMUNITY): Payer: Self-pay

## 2018-01-22 ENCOUNTER — Other Ambulatory Visit: Payer: Self-pay

## 2018-01-22 DIAGNOSIS — Z01812 Encounter for preprocedural laboratory examination: Secondary | ICD-10-CM | POA: Diagnosis not present

## 2018-01-22 DIAGNOSIS — K429 Umbilical hernia without obstruction or gangrene: Secondary | ICD-10-CM | POA: Diagnosis not present

## 2018-01-22 HISTORY — DX: Unspecified convulsions: R56.9

## 2018-01-22 HISTORY — DX: Cardiac murmur, unspecified: R01.1

## 2018-01-22 HISTORY — DX: Major depressive disorder, single episode, unspecified: F32.9

## 2018-01-22 HISTORY — DX: Depression, unspecified: F32.A

## 2018-01-22 LAB — CBC WITH DIFFERENTIAL/PLATELET
BASOS ABS: 0.1 10*3/uL (ref 0.0–0.1)
BASOS PCT: 1 %
EOS PCT: 5 %
Eosinophils Absolute: 0.4 10*3/uL (ref 0.0–0.7)
HEMATOCRIT: 42.1 % (ref 36.0–46.0)
Hemoglobin: 13.7 g/dL (ref 12.0–15.0)
LYMPHS PCT: 21 %
Lymphs Abs: 1.6 10*3/uL (ref 0.7–4.0)
MCH: 30.4 pg (ref 26.0–34.0)
MCHC: 32.5 g/dL (ref 30.0–36.0)
MCV: 93.6 fL (ref 78.0–100.0)
MONO ABS: 0.6 10*3/uL (ref 0.1–1.0)
MONOS PCT: 8 %
Neutro Abs: 4.8 10*3/uL (ref 1.7–7.7)
Neutrophils Relative %: 65 %
PLATELETS: 276 10*3/uL (ref 150–400)
RBC: 4.5 MIL/uL (ref 3.87–5.11)
RDW: 13.7 % (ref 11.5–15.5)
WBC: 7.3 10*3/uL (ref 4.0–10.5)

## 2018-01-22 LAB — HCG, SERUM, QUALITATIVE: PREG SERUM: NEGATIVE

## 2018-01-22 LAB — GLUCOSE, CAPILLARY: Glucose-Capillary: 241 mg/dL — ABNORMAL HIGH (ref 65–99)

## 2018-01-22 NOTE — Progress Notes (Addendum)
ekg 10-30-17 epic Labs 12-10-17 a1c 7.9 cbc and cmp 01-08-18 epic Stress 2017 epic

## 2018-01-24 DIAGNOSIS — F411 Generalized anxiety disorder: Secondary | ICD-10-CM | POA: Diagnosis not present

## 2018-01-26 ENCOUNTER — Encounter (HOSPITAL_COMMUNITY)
Admission: RE | Disposition: A | Discharge status: Home / Self Care | Payer: Self-pay | Source: Ambulatory Visit | Attending: Surgery

## 2018-01-26 ENCOUNTER — Ambulatory Visit (HOSPITAL_COMMUNITY)
Admission: RE | Admit: 2018-01-26 | Discharge: 2018-01-26 | Disposition: A | Discharge status: Home / Self Care | Payer: BLUE CROSS/BLUE SHIELD | Source: Ambulatory Visit | Attending: Surgery | Admitting: Surgery

## 2018-01-26 ENCOUNTER — Encounter (HOSPITAL_COMMUNITY): Payer: Self-pay | Admitting: *Deleted

## 2018-01-26 ENCOUNTER — Ambulatory Visit (HOSPITAL_COMMUNITY): Payer: BLUE CROSS/BLUE SHIELD | Admitting: Certified Registered Nurse Anesthetist

## 2018-01-26 DIAGNOSIS — F329 Major depressive disorder, single episode, unspecified: Secondary | ICD-10-CM | POA: Insufficient documentation

## 2018-01-26 DIAGNOSIS — I10 Essential (primary) hypertension: Secondary | ICD-10-CM | POA: Diagnosis not present

## 2018-01-26 DIAGNOSIS — Z8619 Personal history of other infectious and parasitic diseases: Secondary | ICD-10-CM | POA: Insufficient documentation

## 2018-01-26 DIAGNOSIS — Z888 Allergy status to other drugs, medicaments and biological substances status: Secondary | ICD-10-CM | POA: Diagnosis not present

## 2018-01-26 DIAGNOSIS — Z91040 Latex allergy status: Secondary | ICD-10-CM | POA: Insufficient documentation

## 2018-01-26 DIAGNOSIS — Z87891 Personal history of nicotine dependence: Secondary | ICD-10-CM | POA: Insufficient documentation

## 2018-01-26 DIAGNOSIS — Z8673 Personal history of transient ischemic attack (TIA), and cerebral infarction without residual deficits: Secondary | ICD-10-CM | POA: Diagnosis not present

## 2018-01-26 DIAGNOSIS — F419 Anxiety disorder, unspecified: Secondary | ICD-10-CM | POA: Diagnosis not present

## 2018-01-26 DIAGNOSIS — Z9641 Presence of insulin pump (external) (internal): Secondary | ICD-10-CM | POA: Diagnosis not present

## 2018-01-26 DIAGNOSIS — Z79899 Other long term (current) drug therapy: Secondary | ICD-10-CM | POA: Diagnosis not present

## 2018-01-26 DIAGNOSIS — K420 Umbilical hernia with obstruction, without gangrene: Secondary | ICD-10-CM | POA: Insufficient documentation

## 2018-01-26 DIAGNOSIS — Z794 Long term (current) use of insulin: Secondary | ICD-10-CM | POA: Insufficient documentation

## 2018-01-26 DIAGNOSIS — E119 Type 2 diabetes mellitus without complications: Secondary | ICD-10-CM | POA: Diagnosis not present

## 2018-01-26 DIAGNOSIS — K4090 Unilateral inguinal hernia, without obstruction or gangrene, not specified as recurrent: Secondary | ICD-10-CM | POA: Diagnosis not present

## 2018-01-26 DIAGNOSIS — R0600 Dyspnea, unspecified: Secondary | ICD-10-CM | POA: Diagnosis not present

## 2018-01-26 DIAGNOSIS — E109 Type 1 diabetes mellitus without complications: Secondary | ICD-10-CM | POA: Diagnosis not present

## 2018-01-26 HISTORY — PX: UMBILICAL HERNIA REPAIR: SHX196

## 2018-01-26 HISTORY — PX: INSERTION OF MESH: SHX5868

## 2018-01-26 LAB — GLUCOSE, CAPILLARY
Glucose-Capillary: 145 mg/dL — ABNORMAL HIGH (ref 65–99)
Glucose-Capillary: 177 mg/dL — ABNORMAL HIGH (ref 65–99)
Glucose-Capillary: 237 mg/dL — ABNORMAL HIGH (ref 65–99)

## 2018-01-26 SURGERY — REPAIR, HERNIA, UMBILICAL, ADULT
Anesthesia: General | Site: Abdomen

## 2018-01-26 MED ORDER — ROCURONIUM BROMIDE 10 MG/ML (PF) SYRINGE
PREFILLED_SYRINGE | INTRAVENOUS | Status: AC
  Filled 2018-01-26: qty 5

## 2018-01-26 MED ORDER — CEFAZOLIN SODIUM-DEXTROSE 2-4 GM/100ML-% IV SOLN
2.0000 g | INTRAVENOUS | Status: AC
  Administered 2018-01-26: 2 g via INTRAVENOUS
  Filled 2018-01-26: qty 100

## 2018-01-26 MED ORDER — ONDANSETRON HCL 4 MG/2ML IJ SOLN
INTRAMUSCULAR | Status: DC | PRN
  Administered 2018-01-26: 4 mg via INTRAVENOUS

## 2018-01-26 MED ORDER — PROPOFOL 10 MG/ML IV BOLUS
INTRAVENOUS | Status: DC | PRN
  Administered 2018-01-26: 120 mg via INTRAVENOUS

## 2018-01-26 MED ORDER — CHLORHEXIDINE GLUCONATE 4 % EX LIQD: 60.0000 mL | Freq: Once | CUTANEOUS | Status: DC

## 2018-01-26 MED ORDER — ACETAMINOPHEN 325 MG PO TABS: 650.0000 mg | ORAL_TABLET | ORAL | Status: DC | PRN

## 2018-01-26 MED ORDER — PROPOFOL 10 MG/ML IV BOLUS
INTRAVENOUS | Status: AC
  Filled 2018-01-26: qty 20

## 2018-01-26 MED ORDER — SODIUM CHLORIDE 0.9 % IV SOLN: 250.0000 mL | INTRAVENOUS | Status: DC | PRN

## 2018-01-26 MED ORDER — BUPIVACAINE-EPINEPHRINE 0.5% -1:200000 IJ SOLN
INTRAMUSCULAR | Status: DC | PRN
  Administered 2018-01-26: 30 mL

## 2018-01-26 MED ORDER — 0.9 % SODIUM CHLORIDE (POUR BTL) OPTIME
TOPICAL | Status: DC | PRN
  Administered 2018-01-26: 1000 mL

## 2018-01-26 MED ORDER — FENTANYL CITRATE (PF) 250 MCG/5ML IJ SOLN
INTRAMUSCULAR | Status: AC
Start: 2018-01-26 — End: ?
  Filled 2018-01-26: qty 5

## 2018-01-26 MED ORDER — GABAPENTIN 300 MG PO CAPS
300.0000 mg | ORAL_CAPSULE | ORAL | Status: AC
  Administered 2018-01-26: 300 mg via ORAL
  Filled 2018-01-26: qty 1

## 2018-01-26 MED ORDER — SUGAMMADEX SODIUM 200 MG/2ML IV SOLN
INTRAVENOUS | Status: AC
  Filled 2018-01-26: qty 2

## 2018-01-26 MED ORDER — DOCUSATE SODIUM 100 MG PO CAPS: 100.0000 mg | ORAL_CAPSULE | Freq: Two times a day (BID) | ORAL | 0 refills | Status: AC

## 2018-01-26 MED ORDER — LIDOCAINE 2% (20 MG/ML) 5 ML SYRINGE
INTRAMUSCULAR | Status: DC | PRN
  Administered 2018-01-26: 70 mg via INTRAVENOUS

## 2018-01-26 MED ORDER — FENTANYL CITRATE (PF) 100 MCG/2ML IJ SOLN: 25.0000 ug | INTRAMUSCULAR | Status: DC | PRN

## 2018-01-26 MED ORDER — ONDANSETRON HCL 4 MG/2ML IJ SOLN
INTRAMUSCULAR | Status: AC
  Filled 2018-01-26: qty 2

## 2018-01-26 MED ORDER — FENTANYL CITRATE (PF) 100 MCG/2ML IJ SOLN
INTRAMUSCULAR | Status: DC | PRN
  Administered 2018-01-26 (×5): 50 ug via INTRAVENOUS
  Administered 2018-01-26: 100 ug via INTRAVENOUS

## 2018-01-26 MED ORDER — MIDAZOLAM HCL 2 MG/2ML IJ SOLN
INTRAMUSCULAR | Status: AC
  Filled 2018-01-26: qty 2

## 2018-01-26 MED ORDER — HYDROCODONE-ACETAMINOPHEN 5-325 MG PO TABS: 1.0000 | ORAL_TABLET | Freq: Four times a day (QID) | ORAL | 0 refills | Status: DC | PRN

## 2018-01-26 MED ORDER — SODIUM CHLORIDE 0.9% FLUSH: 3.0000 mL | Freq: Two times a day (BID) | INTRAVENOUS | Status: DC

## 2018-01-26 MED ORDER — SODIUM CHLORIDE 0.9% FLUSH: 3.0000 mL | INTRAVENOUS | Status: DC | PRN

## 2018-01-26 MED ORDER — OXYCODONE HCL 5 MG PO TABS
ORAL_TABLET | ORAL | Status: AC
  Administered 2018-01-26: 10 mg via ORAL
  Filled 2018-01-26: qty 2

## 2018-01-26 MED ORDER — OXYCODONE HCL 5 MG PO TABS
5.0000 mg | ORAL_TABLET | ORAL | Status: DC | PRN
  Administered 2018-01-26: 10 mg via ORAL

## 2018-01-26 MED ORDER — ROCURONIUM BROMIDE 50 MG/5ML IV SOSY
PREFILLED_SYRINGE | INTRAVENOUS | Status: DC | PRN
  Administered 2018-01-26 (×2): 10 mg via INTRAVENOUS
  Administered 2018-01-26: 50 mg via INTRAVENOUS

## 2018-01-26 MED ORDER — SUGAMMADEX SODIUM 200 MG/2ML IV SOLN
INTRAVENOUS | Status: DC | PRN
  Administered 2018-01-26: 200 mg via INTRAVENOUS

## 2018-01-26 MED ORDER — LACTATED RINGERS IV SOLN
INTRAVENOUS | Status: DC
  Administered 2018-01-26 (×2): via INTRAVENOUS

## 2018-01-26 MED ORDER — BUPIVACAINE-EPINEPHRINE (PF) 0.5% -1:200000 IJ SOLN
INTRAMUSCULAR | Status: AC
  Filled 2018-01-26: qty 30

## 2018-01-26 MED ORDER — LIDOCAINE 2% (20 MG/ML) 5 ML SYRINGE
INTRAMUSCULAR | Status: AC
  Filled 2018-01-26: qty 5

## 2018-01-26 MED ORDER — ACETAMINOPHEN 325 MG PO TABS: 650.0000 mg | ORAL_TABLET | Freq: Four times a day (QID) | ORAL | Status: DC | PRN

## 2018-01-26 MED ORDER — MIDAZOLAM HCL 5 MG/5ML IJ SOLN
INTRAMUSCULAR | Status: DC | PRN
  Administered 2018-01-26: 2 mg via INTRAVENOUS

## 2018-01-26 MED ORDER — ACETAMINOPHEN 500 MG PO TABS
1000.0000 mg | ORAL_TABLET | ORAL | Status: AC
  Administered 2018-01-26: 1000 mg via ORAL
  Filled 2018-01-26: qty 2

## 2018-01-26 MED ORDER — FENTANYL CITRATE (PF) 100 MCG/2ML IJ SOLN
INTRAMUSCULAR | Status: AC
  Filled 2018-01-26: qty 2

## 2018-01-26 MED ORDER — ACETAMINOPHEN 650 MG RE SUPP
650.0000 mg | RECTAL | Status: DC | PRN
  Filled 2018-01-26: qty 1

## 2018-01-26 SURGICAL SUPPLY — 28 items
APL SKNCLS STERI-STRIP NONHPOA (GAUZE/BANDAGES/DRESSINGS) ×1
BALL CTTN LRG ABS STRL LF (GAUZE/BANDAGES/DRESSINGS) ×1
BENZOIN TINCTURE PRP APPL 2/3 (GAUZE/BANDAGES/DRESSINGS) ×2 IMPLANT
CHLORAPREP W/TINT 26ML (MISCELLANEOUS) ×2 IMPLANT
COTTONBALL LRG STERILE PKG (GAUZE/BANDAGES/DRESSINGS) ×2 IMPLANT
COVER SURGICAL LIGHT HANDLE (MISCELLANEOUS) ×2 IMPLANT
DECANTER SPIKE VIAL GLASS SM (MISCELLANEOUS) IMPLANT
DRAPE LAPAROSCOPIC ABDOMINAL (DRAPES) ×2 IMPLANT
DRSG TEGADERM 4X4.75 (GAUZE/BANDAGES/DRESSINGS) ×2 IMPLANT
ELECT REM PT RETURN 15FT ADLT (MISCELLANEOUS) ×2 IMPLANT
GAUZE SPONGE 4X4 12PLY STRL (GAUZE/BANDAGES/DRESSINGS) IMPLANT
GLOVE BIO SURGEON STRL SZ 6 (GLOVE) IMPLANT
GLOVE INDICATOR 6.5 STRL GRN (GLOVE) IMPLANT
GOWN STRL REUS W/TWL LRG LVL3 (GOWN DISPOSABLE) ×4 IMPLANT
GOWN STRL REUS W/TWL XL LVL3 (GOWN DISPOSABLE) IMPLANT
KIT BASIN OR (CUSTOM PROCEDURE TRAY) ×2 IMPLANT
MESH VENTRALEX ST 1-7/10 CRC S (Mesh General) ×2 IMPLANT
NEEDLE HYPO 22GX1.5 SAFETY (NEEDLE) ×2 IMPLANT
PACK GENERAL/GYN (CUSTOM PROCEDURE TRAY) ×2 IMPLANT
STRIP CLOSURE SKIN 1/2X4 (GAUZE/BANDAGES/DRESSINGS) ×2 IMPLANT
SUT ETHIBOND 0 MO6 C/R (SUTURE) ×2 IMPLANT
SUT MNCRL AB 4-0 PS2 18 (SUTURE) ×2 IMPLANT
SUT PROLENE 2 0 CT2 30 (SUTURE) IMPLANT
SUT VIC AB 3-0 SH 27 (SUTURE)
SUT VIC AB 3-0 SH 27XBRD (SUTURE) IMPLANT
SYR CONTROL 10ML LL (SYRINGE) ×2 IMPLANT
TOWEL OR 17X26 10 PK STRL BLUE (TOWEL DISPOSABLE) ×2 IMPLANT
TOWEL OR NON WOVEN STRL DISP B (DISPOSABLE) IMPLANT

## 2018-01-26 NOTE — Anesthesia Preprocedure Evaluation (Signed)
Anesthesia Evaluation  Patient identified by MRN, date of birth, ID band Patient awake    Reviewed: Allergy & Precautions, NPO status , Patient's Chart, lab work & pertinent test results  Airway Mallampati: I  TM Distance: >3 FB Neck ROM: Full    Dental   Pulmonary former smoker,    Pulmonary exam normal        Cardiovascular hypertension, Pt. on medications Normal cardiovascular exam  H/O PFO   Neuro/Psych Anxiety Depression TIA   GI/Hepatic   Endo/Other  diabetes, Type 1, Insulin Dependent  Renal/GU      Musculoskeletal   Abdominal   Peds  Hematology   Anesthesia Other Findings   Reproductive/Obstetrics                             Anesthesia Physical Anesthesia Plan  ASA: II  Anesthesia Plan: General   Post-op Pain Management:    Induction: Intravenous  PONV Risk Score and Plan: 3 and Ondansetron and Midazolam  Airway Management Planned: Oral ETT  Additional Equipment:   Intra-op Plan:   Post-operative Plan: Extubation in OR  Informed Consent: I have reviewed the patients History and Physical, chart, labs and discussed the procedure including the risks, benefits and alternatives for the proposed anesthesia with the patient or authorized representative who has indicated his/her understanding and acceptance.     Plan Discussed with: CRNA and Surgeon  Anesthesia Plan Comments:         Anesthesia Quick Evaluation

## 2018-01-26 NOTE — Discharge Instructions (Signed)
HERNIA REPAIR: POST OP INSTRUCTIONS ° °###################################################################### ° °EAT °Gradually transition to a high fiber diet with a fiber supplement over the next few weeks after discharge.  Start with a pureed / full liquid diet (see below) ° °WALK °Walk an hour a day.  Control your pain to do that.   ° °CONTROL PAIN °Control pain so that you can walk, sleep, tolerate sneezing/coughing, and go up/down stairs. ° °HAVE A BOWEL MOVEMENT DAILY °Keep your bowels regular to avoid problems.  OK to try a laxative to override constipation.  OK to use an antidairrheal to slow down diarrhea.  Call if not better after 2 tries ° °CALL IF YOU HAVE PROBLEMS/CONCERNS °Call if you are still struggling despite following these instructions. °Call if you have concerns not answered by these instructions ° °###################################################################### ° ° ° °1. DIET: Follow a light bland diet the first 24 hours after arrival home, such as soup, liquids, crackers, etc.  Be sure to include lots of fluids daily.  Advance to a low fat / high fiber diet over the next few days after surgery.  Avoid fast food or heavy meals the first week as your are more likely to get nauseated.   ° °2. Take your usually prescribed home medications unless otherwise directed. ° °3. PAIN CONTROL: °a. Pain is best controlled by a usual combination of three different methods TOGETHER: °i. Ice/Heat °ii. Over the counter pain medication °iii. Prescription pain medication °b. Most patients will experience some swelling and bruising around the hernia(s) such as the bellybutton, groins, or old incisions.  Ice packs or heating pads (30-60 minutes up to 6 times a day) will help. Use ice for the first few days to help decrease swelling and bruising, then switch to heat to help relax tight/sore spots and speed recovery.  Some people prefer to use ice alone, heat alone, alternating between ice & heat.  Experiment  to what works for you.  Swelling and bruising can take several weeks to resolve.   °c. It is helpful to take an over-the-counter pain medication regularly for the first few weeks.  Choose one of the following that works best for you: °i. Naproxen (Aleve, etc)  Two 220mg tabs twice a day °ii. Ibuprofen (Advil, etc) Three 200mg tabs four times a day (every meal & bedtime) °iii. Acetaminophen (Tylenol, etc) 325-650mg four times a day (every meal & bedtime) °d. A  prescription for pain medication should be given to you upon discharge.  Take your pain medication as prescribed.  °i. If you are having problems/concerns with the prescription medicine (does not control pain, nausea, vomiting, rash, itching, etc), please call us [phone] to see if we need to switch you to a different pain medicine that will work better for you and/or control your side effect better. °ii. If you need a refill on your pain medication, please contact your pharmacy.  They will contact our office to request authorization. Prescriptions will not be filled after 5 pm or on week-ends. ° °4. Avoid getting constipated.  Between the surgery and the pain medications, it is common to experience some constipation.  Increasing fluid intake and taking a fiber supplement (such as Metamucil, Citrucel, FiberCon, MiraLax, etc) 1-2 times a day regularly will usually help prevent this problem from occurring.  A mild laxative (prune juice, Milk of Magnesia, MiraLax, etc) should be taken according to package directions if there are no bowel movements after 48 hours.   ° °5. Wash / shower every   day starting post-op day 2.  You may shower over the dressings as they are waterproof.    6. Remove your waterproof bandages 2 days after surgery.  Steri strips will peel off after 1-2 weeks. You may leave the incisions open to air.  You may replace a dressing/Band-Aid to cover an incision for comfort if you wish.  Continue to shower over incision(s) after the dressing  is off. No rubbing, scrubbing, lotions or ointments to incisions.  7. ACTIVITIES as tolerated:   a. You may resume regular (light) daily activities beginning the next day--such as daily self-care, walking, climbing stairs--gradually increasing activities as tolerated.  If you can walk 30 minutes without difficulty, it is safe to try more intense activity such as jogging, treadmill, bicycling, low-impact aerobics, swimming, etc. b. Refrain from the most intensive and strenuous activity for last such as sit-ups, heavy lifting, contact sports, etc  Refrain from any heavy lifting or straining until 6 weeks after surgery.   c. DO NOT PUSH THROUGH PAIN.  Let pain be your guide: If it hurts to do something, don't do it.  Pain is your body warning you to avoid that activity for another week until the pain goes down. d. You may drive when you are no longer taking prescription pain medication, you can comfortably wear a seatbelt, and you can safely maneuver your car and apply brakes. e. [name] may have sexual intercourse when it is comfortable.   8. FOLLOW UP in our office a. Please call [facility] at [phone] to set up an appointment to see your surgeon in the office for a follow-up appointment approximately 2-3 weeks after your surgery. b. Make sure that you call for this appointment the day you arrive home to insure a convenient appointment time.  9.  If you have disability of FMLA / Family leave forms, please bring the forms to the office for processing.  (do not give to your surgeon).  WHEN TO CALL us [phone]: 1. Poor pain control 2. Reactions / problems with new medications (rash/itching, nausea, etc)  3. Fever over 101.5 F (38.5 C) 4. Inability to urinate 5. Nausea and/or vomiting 6. Worsening swelling or bruising 7. Continued bleeding from incision. 8. Increased pain, redness, or drainage from the incision   The clinic staff is available to answer your questions during regular business  hours (8:30am-5pm).  Please dont hesitate to call and ask to speak to one of our nurses for clinical concerns.   If you have a medical emergency, go to the nearest emergency room or call 911.  A surgeon from [facility] is always on call at the hospitals in [facility], [place], [place], [place], [place]  [address] ?  [address] [address], [place], [place]   [address] MAIN: [phone] ? TOLL FREE: [phone] ? FAX: [phone] [email or web address]

## 2018-01-26 NOTE — Interval H&P Note (Signed)
No change. We again discussed the surgery and risks. Questions answered to her satisfaction. Proceed to OR as planned.

## 2018-01-26 NOTE — Transfer of Care (Signed)
Immediate Anesthesia Transfer of Care Note  Patient: Natalie Ware  Procedure(s) Performed: UMBILICAL HERNIA REPAIR (N/A Abdomen) INSERTION OF MESH (N/A Abdomen)  Patient Location: PACU  Anesthesia Type:General  Level of Consciousness: drowsy and patient cooperative  Airway & Oxygen Therapy: Patient Spontanous Breathing and Patient connected to face mask oxygen  Post-op Assessment: Report given to RN and Post -op Vital signs reviewed and stable  Post vital signs: Reviewed and stable  Last Vitals:  Vitals Value Taken Time  BP 142/81 01/26/2018 10:30 AM  Temp    Pulse 93 01/26/2018 10:30 AM  Resp 15 01/26/2018 10:30 AM  SpO2 100 % 01/26/2018 10:30 AM  Vitals shown include unvalidated device data.  Last Pain:  Vitals:   01/26/18 0803  TempSrc:   PainSc: 0-No pain         Complications: No apparent anesthesia complications

## 2018-01-26 NOTE — Anesthesia Procedure Notes (Signed)
Procedure Name: Intubation Date/Time: 01/26/2018 9:11 AM Performed by: Montel Clock, CRNA Pre-anesthesia Checklist: Patient identified, Emergency Drugs available, Suction available, Patient being monitored and Timeout performed Patient Re-evaluated:Patient Re-evaluated prior to induction Oxygen Delivery Method: Circle system utilized Preoxygenation: Pre-oxygenation with 100% oxygen Induction Type: IV induction Ventilation: Mask ventilation without difficulty Laryngoscope Size: Mac and 3 Grade View: Grade I Tube type: Oral Tube size: 7.5 mm Number of attempts: 1 Airway Equipment and Method: Stylet Placement Confirmation: ETT inserted through vocal cords under direct vision,  positive ETCO2 and breath sounds checked- equal and bilateral Secured at: 21 cm Tube secured with: Tape Dental Injury: Teeth and Oropharynx as per pre-operative assessment

## 2018-01-26 NOTE — Anesthesia Postprocedure Evaluation (Signed)
Anesthesia Post Note  Patient: Natalie Ware  Procedure(s) Performed: UMBILICAL HERNIA REPAIR (N/A Abdomen) INSERTION OF MESH (N/A Abdomen)     Patient location during evaluation: PACU Anesthesia Type: General Level of consciousness: awake and alert Pain management: pain level controlled Vital Signs Assessment: post-procedure vital signs reviewed and stable Respiratory status: spontaneous breathing, nonlabored ventilation, respiratory function stable and patient connected to nasal cannula oxygen Cardiovascular status: blood pressure returned to baseline and stable Postop Assessment: no apparent nausea or vomiting Anesthetic complications: no    Last Vitals:  Vitals:   01/26/18 1126 01/26/18 1155  BP: (!) 146/94 (!) 150/88  Pulse: 79 85  Resp: 18 18  Temp: 36.8 C   SpO2: 100% 98%    Last Pain:  Vitals:   01/26/18 1100  TempSrc:   PainSc: 4                  Jahanna Raether DAVID

## 2018-01-26 NOTE — Op Note (Signed)
Operative Note  VEDANSHI MASSARO  [id number]  [id number]  01/26/2018   Surgeon: Vikki Ports A ConnorMD  Assistant: none  Procedure performed: umbilical hernia repair with mesh   Preop diagnosis: incarcerated recurrent umbilical hernia Post-op diagnosis/intraop findings: same  Specimens: no Retained items: no EBL: minimal cc Complications: none  Description of procedure: After obtaining informed consent the patient was taken to the operating room and placed supine on operating room table wheregeneral endotracheal anesthesia was initiated, preoperative antibiotics were administered, SCDs applied, and a formal timeout was performed. The abdomen was prepped and draped in usual sterile fashion. An infraumbilical curvilinear incision was made sharply and then cautery was used to dissect through the soft tissues until the hernia sac was encountered and circumferentially dissected down to the level of the fascia. The umbilical stalk was isolated and dissected from the underlying fascia using cautery. The single stitch from the prior outpatient repair was encountered. It seemed that on either side of the stitch both inferiorly and superiorly there was a defect in the fascia both with incarcerated fat. The small fascial bridge where the stitch was was divided. The hernia sac was excised level of the fascia. It contained incarcerated omentum which was excised and discarded. The remainder was reduced into the abdominal cavity. The hernia defect was approximately 1 cm in diameter however given the fact there was a recurrence, I did elect to place a 4 cm ventralex coated mesh. This was brought into the field and secured with trans-fascial sutures of 0 Ethibond through the raised ridge on the rough side in the 4 cardinal directions ensuring that the coated side faced the abdominal viscera and that the mesh was flush against the abdominal wall with no entrapped structures. The tails were then trimmed and the  fascial defect was closed transversely with interrupted 0 Ethibonds each of which caughta bit of the remaining tail of the mesh. The fascia and subcutaneous tissue around the umbilicus was infiltrated with local. Hemostasis was ensured in the field. The umbilical skin was then tacked back down to the fascia with a 3-0 Vicryl suture. The skin was closed with subcuticular Monocryl. Steri-Strips, cotton ball and Tegaderm dressing were then applied. The patient was then awakened, extubated and taken to PACU in stable condition.   All counts were correct at the completion of the case.

## 2018-02-04 DIAGNOSIS — F411 Generalized anxiety disorder: Secondary | ICD-10-CM | POA: Diagnosis not present

## 2018-03-17 DIAGNOSIS — Z6826 Body mass index (BMI) 26.0-26.9, adult: Secondary | ICD-10-CM | POA: Diagnosis not present

## 2018-03-17 DIAGNOSIS — E109 Type 1 diabetes mellitus without complications: Secondary | ICD-10-CM | POA: Diagnosis not present

## 2018-03-17 DIAGNOSIS — F419 Anxiety disorder, unspecified: Secondary | ICD-10-CM | POA: Diagnosis not present

## 2018-03-17 DIAGNOSIS — E782 Mixed hyperlipidemia: Secondary | ICD-10-CM | POA: Diagnosis not present

## 2018-04-01 DIAGNOSIS — Z114 Encounter for screening for human immunodeficiency virus [HIV]: Secondary | ICD-10-CM | POA: Diagnosis not present

## 2018-04-01 DIAGNOSIS — Z13220 Encounter for screening for lipoid disorders: Secondary | ICD-10-CM | POA: Diagnosis not present

## 2018-04-01 DIAGNOSIS — Z0000 Encounter for general adult medical examination without abnormal findings: Secondary | ICD-10-CM | POA: Diagnosis not present

## 2018-04-01 DIAGNOSIS — Z1329 Encounter for screening for other suspected endocrine disorder: Secondary | ICD-10-CM | POA: Diagnosis not present

## 2018-05-03 ENCOUNTER — Ambulatory Visit (HOSPITAL_COMMUNITY)
Admission: EM | Admit: 2018-05-03 | Discharge: 2018-05-03 | Disposition: A | Discharge status: Home / Self Care | Payer: BLUE CROSS/BLUE SHIELD | Attending: Family Medicine | Admitting: Family Medicine

## 2018-05-03 ENCOUNTER — Ambulatory Visit (INDEPENDENT_AMBULATORY_CARE_PROVIDER_SITE_OTHER): Payer: BLUE CROSS/BLUE SHIELD

## 2018-05-03 ENCOUNTER — Encounter (HOSPITAL_COMMUNITY): Payer: Self-pay | Admitting: Emergency Medicine

## 2018-05-03 DIAGNOSIS — R0781 Pleurodynia: Secondary | ICD-10-CM

## 2018-05-03 DIAGNOSIS — R0782 Intercostal pain: Secondary | ICD-10-CM | POA: Diagnosis not present

## 2018-05-03 MED ORDER — MELOXICAM 7.5 MG PO TABS: 7.5000 mg | ORAL_TABLET | Freq: Every day | ORAL | 0 refills | Status: AC

## 2018-05-03 MED ORDER — CYCLOBENZAPRINE HCL 5 MG PO TABS: 5.0000 mg | ORAL_TABLET | Freq: Every evening | ORAL | 0 refills | Status: DC | PRN

## 2018-05-03 NOTE — ED Provider Notes (Signed)
Natalie Ware    CSN: [id number] Arrival date & time: 05/03/18  [place]     History   Chief Complaint Chief Complaint  Patient presents with  . rib pain    HPI Natalie Ware is a 52 y.o. female.   52 year old female comes in for 3-day history of right-sided rib pain.  Denies injury/trauma.  Pain is along the right side rib, sharp, constant, worse with movement.  She denies cough, congestion, sore throat.  Denies fever, chills, night sweats.  Denies abdominal pain, nausea, vomiting.  Symptoms first started shortly after waking up.  Denies any increase in activity, heavy lifting.  Tried Tylenol, naproxen without relief.  Tried medicine for gas without relief.  Normal bowel movements, without straining.  She denies having the symptoms in the past, and denies worsening of symptoms with eating.     Past Medical History:  Diagnosis Date  . Anxiety   . Constipation   . Depression   . Heart murmur   . Hypertension   . 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 PFO (patent foramen ovale)    being followed by Dr Andrez Grime problems, no surgery  . Seizures ([place])    52 years old Blood sugar really low in [place]  . Shingles   . TIA (transient ischemic attack)     Patient Active Problem List   Diagnosis Date Noted  . Multiple somatic complaints 01/12/2013  . Bronchitis 09/08/2011  . Screening for malignant neoplasm of the cervix 09/08/2011  . Routine gynecological examination 09/08/2011  . Chest pain 02/17/2011  . Back pain 02/17/2011  . Nausea 02/17/2011  . DIABETES MELLITUS, TYPE I 10/22/2010  . ANXIETY STATE, UNSPECIFIED 10/22/2010  . PATENT FORAMEN OVALE 10/22/2010  . GALACTORRHEA NOT ASSOCIATED WITH CHILDBIRTH 04/25/2009  . CONSTIPATION 04/03/2009  . DYSPNEA 04/03/2009  . DM 03/30/2009  . SMOKER 03/30/2009  . HYPERTENSION 03/30/2009  . TIA 03/30/2009    Past Surgical History:  Procedure Laterality Date  . cervicle fusion    .  FOREIGN BODY REMOVAL     right index finger  . HERNIA REPAIR     umbilical 15 45-OPFYT ago umbilical repair Dr. Kae Heller 01-26-18  . INSERTION OF MESH N/A 01/26/2018   Procedure: INSERTION OF MESH;  Surgeon: Clovis Riley, MD;  Location: [facility];  Service: General;  Laterality: N/A;  . TRIGGER FINGER RELEASE Right   . TRIGGER FINGER RELEASE Left 11/02/2017   Procedure: LEFT THUMB TRIGGER DIGIT RELEASE;  Surgeon: Milly Jakob, MD;  Location: [facility];  Service: Orthopedics;  Laterality: Left;  . UMBILICAL HERNIA REPAIR N/A 01/26/2018   Procedure: UMBILICAL HERNIA REPAIR;  Surgeon: Clovis Riley, MD;  Location: [facility];  Service: General;  Laterality: N/A;    OB History   None      Home Medications    Prior to Admission medications   Medication Sig Start Date End Date Taking? Authorizing Provider  acetaminophen (TYLENOL) 325 MG tablet Take 2 tablets (650 mg total) by mouth every 6 (six) hours as needed. 01/26/18   Clovis Riley, MD  ALPRAZolam Duanne Moron) 0.5 MG tablet Take 0.5 mg by mouth daily as needed for anxiety.  12/26/15   [provider]  Black Cohosh 80 MG CAPS Take 80 mg by mouth daily.    [provider]  Black Cohosh-SoyIsoflav-Magnol (ESTROVEN MENOPAUSE RELIEF PO) Take 1 tablet by mouth daily.    [provider]  Continuous Blood Gluc Transmit (DEXCOM G6 TRANSMITTER) MISC 1 each by Does not apply route daily.    [provider]  cyclobenzaprine (FLEXERIL) 5 MG tablet Take 1 tablet (5 mg total) by mouth at bedtime as needed for muscle spasms. 05/03/18   [name], Landy V, PA-C  escitalopram (LEXAPRO) 20 MG tablet Take 20 mg by mouth daily. 11/23/15   [provider]  HYDROcodone-acetaminophen (NORCO/VICODIN) 5-325 MG tablet Take 1 tablet by mouth every 6 (six) hours as needed for moderate pain. 01/26/18   Clovis Riley, MD  ibuprofen (ADVIL) 200 MG tablet Take 3 tablets (600 mg total) by mouth every 6 (six) hours.  11/02/17   Milly Jakob, MD  Insulin Human (INSULIN PUMP) SOLN Inject into the skin. Novolog    [provider]  losartan (COZAAR) 100 MG tablet Take 100 mg by mouth daily.    [provider]  losartan (COZAAR) 50 MG tablet Take 50 mg by mouth daily. 01/23/17   [provider]  meloxicam (MOBIC) 7.5 MG tablet Take 1 tablet (7.5 mg total) by mouth daily. 05/03/18   [name], Zavia V, PA-C  montelukast (SINGULAIR) 10 MG tablet Take 10 mg by mouth at bedtime.    [provider]  naproxen sodium (ALEVE) 220 MG tablet Take 220 mg by mouth 2 (two) times daily as needed (pain).    [provider]  NOVOLOG 100 UNIT/ML injection 25-30 Units. Inject into skin as directed VIA Pump 06/20/15   [provider]  ondansetron (ZOFRAN) 4 MG tablet Take 4 mg by mouth every 8 (eight) hours as needed for nausea or vomiting.    [provider]    Family History Family History  Problem Relation Age of Onset  . Colon cancer Maternal Grandfather   . Cancer Maternal Grandfather        colon  . Diabetes Mother   . Ovarian cancer Paternal Aunt   . Cancer Paternal Aunt        ovarian    Social History Social History   Tobacco Use  . Smoking status: Former Smoker    Last attempt to quit: 09/08/2008    Years since quitting: 9.6  . Smokeless tobacco: Never Used  Substance Use Topics  . Alcohol use: Yes    Comment: social  . Drug use: No     Allergies   Latex; Insulin glargine; Lubiprostone; and Metoclopramide hcl   Review of Systems Review of Systems  Reason unable to perform ROS: See HPI as above.     Physical Exam Triage Vital Signs ED Triage Vitals [05/03/18 1859]  Enc Vitals Group     BP (!) 136/93     Pulse Rate 99     Resp 18     Temp 97.7 F (36.5 C)     Temp Source Oral     SpO2 100 %     Weight      Height      Head Circumference      Peak Flow      Pain Score      Pain Loc      Pain Edu?      Excl. in [place]?    No data  found.  Updated Vital Signs BP (!) 136/93 (BP Location: Left Arm)   Pulse 99   Temp 97.7 F (36.5 C) (Oral)   Resp 18   SpO2 100%   Physical Exam  Constitutional: She is oriented to person, place,  and time. She appears well-developed and well-nourished. No distress.  HENT:  Head: Normocephalic and atraumatic.  Eyes: Pupils are equal, round, and reactive to light. Conjunctivae are normal.  Neck: Normal range of motion. Neck supple.  Cardiovascular: Normal rate, regular rhythm and normal heart sounds. Exam reveals no gallop and no friction rub.  No murmur heard. Pulmonary/Chest: Effort normal and breath sounds normal. No accessory muscle usage or stridor. No respiratory distress. She has no decreased breath sounds. She has no wheezes. She has no rhonchi. She has no rales.  No rashes seen.  Tenderness to palpation along the right lower rib cage.   Abdominal: Soft. Bowel sounds are normal. There is no tenderness. There is no rigidity, no rebound, no guarding, no CVA tenderness and negative Murphy's sign.  Neurological: She is alert and oriented to person, place, and time.  Skin: Skin is warm and dry. She is not diaphoretic.    UC Treatments / Results  Labs (all labs ordered are listed, but only abnormal results are displayed) Labs Reviewed - No data to display  EKG None  Radiology Dg Ribs Unilateral W/chest Right  Result Date: 05/03/2018 CLINICAL DATA:  Right rib pain.  No injury. EXAM: RIGHT RIBS AND CHEST - 3+ VIEW COMPARISON:  Chest CT 03/27/2017 FINDINGS: No fracture or other bone lesions are seen involving the ribs. There is no evidence of pneumothorax or pleural effusion. Both lungs are clear. Heart size and mediastinal contours are within normal limits. IMPRESSION: Negative. Electronically Signed   By: Rolm Baptise M.D.   On: 05/03/2018 19:45    Procedures Procedures (including critical care time)  Medications Ordered in UC Medications - No data to display  Initial  Impression / Assessment and Plan / UC Course  I have reviewed the triage vital signs and the nursing notes.  Pertinent labs & imaging results that were available during my care of the patient were reviewed by me and considered in my medical decision making (see chart for details).    X-ray negative for rib fracture, acute cardiopulmonary disease.  Will treat for muscle pain with Mobic, Flexeril.  Discussed possible gallstones causing symptoms as well.  Return precautions given.  Patient expresses understanding and agrees to plan.  Final Clinical Impressions(s) / UC Diagnoses   Final diagnoses:  Rib pain on right side    ED Prescriptions    Medication Sig Dispense Auth. Provider   meloxicam (MOBIC) 7.5 MG tablet Take 1 tablet (7.5 mg total) by mouth daily. 15 tablet Maddyx Wieck, Rasheida V, PA-C   cyclobenzaprine (FLEXERIL) 5 MG tablet Take 1 tablet (5 mg total) by mouth at bedtime as needed for muscle spasms. 10 tablet Mazi, [place], [place] 05/03/18 2047

## 2018-05-03 NOTE — ED Triage Notes (Signed)
Pt sts right sided rib pain x 3 days worse with movement

## 2018-05-03 NOTE — Discharge Instructions (Signed)
Chest xray negative for rib fracture/lung disease. Start Mobic. Do not take ibuprofen (motrin/advil)/ naproxen (aleve) while on mobic.  Flexeril as needed at night. Flexeril can make you drowsy, so do not take if you are going to drive, operate heavy machinery, or make important decisions. Ice/heat compresses as needed. This can take up to 3-4 weeks to completely resolve, but you should be feeling better each week. As discussed, also possibility of gall bladder causing symptoms. If experiencing nausea/vomiting, shortness of breath, palpitations, weakness, dizziness, go to the emergency department for further evaluation needed.

## 2018-05-05 ENCOUNTER — Encounter (HOSPITAL_COMMUNITY): Payer: Self-pay | Admitting: *Deleted

## 2018-05-05 ENCOUNTER — Emergency Department (HOSPITAL_COMMUNITY)
Admission: EM | Admit: 2018-05-05 | Discharge: 2018-05-05 | Disposition: A | Discharge status: Home / Self Care | Payer: BLUE CROSS/BLUE SHIELD | Attending: Emergency Medicine | Admitting: Emergency Medicine

## 2018-05-05 ENCOUNTER — Other Ambulatory Visit: Payer: Self-pay

## 2018-05-05 ENCOUNTER — Emergency Department (HOSPITAL_COMMUNITY): Payer: BLUE CROSS/BLUE SHIELD

## 2018-05-05 DIAGNOSIS — S3991XA Unspecified injury of abdomen, initial encounter: Secondary | ICD-10-CM | POA: Diagnosis present

## 2018-05-05 DIAGNOSIS — Y929 Unspecified place or not applicable: Secondary | ICD-10-CM | POA: Diagnosis not present

## 2018-05-05 DIAGNOSIS — Z87891 Personal history of nicotine dependence: Secondary | ICD-10-CM | POA: Diagnosis not present

## 2018-05-05 DIAGNOSIS — S39011A Strain of muscle, fascia and tendon of abdomen, initial encounter: Secondary | ICD-10-CM | POA: Insufficient documentation

## 2018-05-05 DIAGNOSIS — E109 Type 1 diabetes mellitus without complications: Secondary | ICD-10-CM | POA: Diagnosis not present

## 2018-05-05 DIAGNOSIS — R1011 Right upper quadrant pain: Secondary | ICD-10-CM | POA: Diagnosis not present

## 2018-05-05 DIAGNOSIS — R0781 Pleurodynia: Secondary | ICD-10-CM | POA: Insufficient documentation

## 2018-05-05 DIAGNOSIS — X58XXXA Exposure to other specified factors, initial encounter: Secondary | ICD-10-CM | POA: Diagnosis not present

## 2018-05-05 DIAGNOSIS — Z79899 Other long term (current) drug therapy: Secondary | ICD-10-CM | POA: Diagnosis not present

## 2018-05-05 DIAGNOSIS — I10 Essential (primary) hypertension: Secondary | ICD-10-CM | POA: Diagnosis not present

## 2018-05-05 DIAGNOSIS — T148XXA Other injury of unspecified body region, initial encounter: Secondary | ICD-10-CM

## 2018-05-05 DIAGNOSIS — Z794 Long term (current) use of insulin: Secondary | ICD-10-CM | POA: Insufficient documentation

## 2018-05-05 DIAGNOSIS — K7689 Other specified diseases of liver: Secondary | ICD-10-CM | POA: Diagnosis not present

## 2018-05-05 DIAGNOSIS — Y939 Activity, unspecified: Secondary | ICD-10-CM | POA: Insufficient documentation

## 2018-05-05 DIAGNOSIS — M549 Dorsalgia, unspecified: Secondary | ICD-10-CM | POA: Diagnosis not present

## 2018-05-05 DIAGNOSIS — Y999 Unspecified external cause status: Secondary | ICD-10-CM | POA: Insufficient documentation

## 2018-05-05 LAB — CBC WITH DIFFERENTIAL/PLATELET
BASOS ABS: 0 10*3/uL (ref 0.0–0.1)
BASOS PCT: 0 %
EOS PCT: 4 %
Eosinophils Absolute: 0.3 10*3/uL (ref 0.0–0.7)
HCT: 41.5 % (ref 36.0–46.0)
Hemoglobin: 14.4 g/dL (ref 12.0–15.0)
Lymphocytes Relative: 26 %
Lymphs Abs: 2 10*3/uL (ref 0.7–4.0)
MCH: 30.7 pg (ref 26.0–34.0)
MCHC: 34.7 g/dL (ref 30.0–36.0)
MCV: 88.5 fL (ref 78.0–100.0)
MONO ABS: 0.6 10*3/uL (ref 0.1–1.0)
MONOS PCT: 8 %
NEUTROS ABS: 4.7 10*3/uL (ref 1.7–7.7)
Neutrophils Relative %: 62 %
Platelets: 271 10*3/uL (ref 150–400)
RBC: 4.69 MIL/uL (ref 3.87–5.11)
RDW: 12.9 % (ref 11.5–15.5)
WBC: 7.7 10*3/uL (ref 4.0–10.5)

## 2018-05-05 LAB — COMPREHENSIVE METABOLIC PANEL
ALBUMIN: 4.2 g/dL (ref 3.5–5.0)
ALT: 22 U/L (ref 0–44)
ANION GAP: 12 (ref 5–15)
AST: 22 U/L (ref 15–41)
Alkaline Phosphatase: 72 U/L (ref 38–126)
BUN: 15 mg/dL (ref 6–20)
CO2: 27 mmol/L (ref 22–32)
Calcium: 9.4 mg/dL (ref 8.9–10.3)
Chloride: 97 mmol/L — ABNORMAL LOW (ref 98–111)
Creatinine, Ser: 0.79 mg/dL (ref 0.44–1.00)
GFR calc Af Amer: >60 mL/min (ref >60)
GFR calc non Af Amer: >60 mL/min (ref >60)
GLUCOSE: 297 mg/dL — AB (ref 70–99)
POTASSIUM: 4.5 mmol/L (ref 3.5–5.1)
SODIUM: 136 mmol/L (ref 135–145)
Total Bilirubin: 0.8 mg/dL (ref 0.3–1.2)
Total Protein: 7.8 g/dL (ref 6.5–8.1)

## 2018-05-05 LAB — LIPASE, BLOOD: LIPASE: 34 U/L (ref 11–51)

## 2018-05-05 MED ORDER — LORAZEPAM 2 MG/ML IJ SOLN
1.0000 mg | Freq: Once | INTRAMUSCULAR | Status: AC
  Administered 2018-05-05: 1 mg via INTRAVENOUS
  Filled 2018-05-05: qty 1

## 2018-05-05 MED ORDER — IOPAMIDOL (ISOVUE-370) INJECTION 76%
100.0000 mL | Freq: Once | INTRAVENOUS | Status: AC | PRN
  Administered 2018-05-05: 100 mL via INTRAVENOUS

## 2018-05-05 MED ORDER — DIAZEPAM 2 MG PO TABS: 2.0000 mg | ORAL_TABLET | Freq: Four times a day (QID) | ORAL | 0 refills | Status: DC | PRN

## 2018-05-05 MED ORDER — HYDROMORPHONE HCL 1 MG/ML IJ SOLN
1.0000 mg | Freq: Once | INTRAMUSCULAR | Status: AC
  Administered 2018-05-05: 1 mg via INTRAVENOUS
  Filled 2018-05-05: qty 1

## 2018-05-05 MED ORDER — KETOROLAC TROMETHAMINE 30 MG/ML IJ SOLN
30.0000 mg | Freq: Once | INTRAMUSCULAR | Status: AC
  Administered 2018-05-05: 30 mg via INTRAVENOUS
  Filled 2018-05-05: qty 1

## 2018-05-05 MED ORDER — ONDANSETRON HCL 4 MG/2ML IJ SOLN
4.0000 mg | Freq: Once | INTRAMUSCULAR | Status: AC
  Administered 2018-05-05: 4 mg via INTRAVENOUS
  Filled 2018-05-05: qty 2

## 2018-05-05 MED ORDER — HYDROCODONE-ACETAMINOPHEN 5-325 MG PO TABS: 1.0000 | ORAL_TABLET | ORAL | 0 refills | Status: DC | PRN

## 2018-05-05 MED ORDER — SODIUM CHLORIDE 0.9 % IV SOLN
INTRAVENOUS | Status: DC
  Administered 2018-05-05: 12:00:00 via INTRAVENOUS

## 2018-05-05 MED ORDER — MORPHINE SULFATE (PF) 4 MG/ML IV SOLN
4.0000 mg | Freq: Once | INTRAVENOUS | Status: AC
  Administered 2018-05-05: 4 mg via INTRAVENOUS
  Filled 2018-05-05: qty 1

## 2018-05-05 NOTE — ED Triage Notes (Signed)
Pt continues to have RUQ pain moving to her mid back. Seen at [facility] at [facility] on Sunday for same.

## 2018-05-05 NOTE — Discharge Instructions (Addendum)
Please follow-up with your PCP and a gastroenterologist for further management.  Please use the medications provided and stay hydrated.  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  If any symptoms change or worsen or you cannot maintain hydration, please return to the nearest emergency department.

## 2018-05-05 NOTE — ED Provider Notes (Signed)
[facility] DEPT Provider Note   CSN: [id number] Arrival date & time: 05/05/18  1109     History   Chief Complaint Chief Complaint  Patient presents with  . Abdominal Pain  . Back Pain    HPI Monta ANU STAGNER is a 52 y.o. female.  52 year old female presents with right upper quadrant pain x3 days.  Pain is been persistent and radiates to her back.  Has not been pleuritic.  She has had no cough or congestion.  No fever chills.  No rashes noted.  Went to urgent care and had a chest x-ray 2 days ago and placed on muscle relaxants and anti-inflammatories without any improvement.  She denies any dyspnea on exertion.  No vomiting or diarrhea.  Nothing makes her symptoms better and no prior history of same     Past Medical History:  Diagnosis Date  . Anxiety   . Constipation   . Depression   . Heart murmur   . Hypertension   . 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 PFO (patent foramen ovale)    being followed by Dr Andrez Grime problems, no surgery  . Seizures ([place])    52 years old Blood sugar really low in [place]  . Shingles   . TIA (transient ischemic attack)     Patient Active Problem List   Diagnosis Date Noted  . Multiple somatic complaints 01/12/2013  . Bronchitis 09/08/2011  . Screening for malignant neoplasm of the cervix 09/08/2011  . Routine gynecological examination 09/08/2011  . Chest pain 02/17/2011  . Back pain 02/17/2011  . Nausea 02/17/2011  . DIABETES MELLITUS, TYPE I 10/22/2010  . ANXIETY STATE, UNSPECIFIED 10/22/2010  . PATENT FORAMEN OVALE 10/22/2010  . GALACTORRHEA NOT ASSOCIATED WITH CHILDBIRTH 04/25/2009  . CONSTIPATION 04/03/2009  . DYSPNEA 04/03/2009  . DM 03/30/2009  . SMOKER 03/30/2009  . HYPERTENSION 03/30/2009  . TIA 03/30/2009    Past Surgical History:  Procedure Laterality Date  . cervicle fusion    . FOREIGN BODY REMOVAL     right index finger  . HERNIA REPAIR     umbilical 15 18-ACZYS ago umbilical repair Dr. Kae Heller 01-26-18  . INSERTION OF MESH N/A 01/26/2018   Procedure: INSERTION OF MESH;  Surgeon: Clovis Riley, MD;  Location: [facility];  Service: General;  Laterality: N/A;  . TRIGGER FINGER RELEASE Right   . TRIGGER FINGER RELEASE Left 11/02/2017   Procedure: LEFT THUMB TRIGGER DIGIT RELEASE;  Surgeon: Milly Jakob, MD;  Location: [facility];  Service: Orthopedics;  Laterality: Left;  . UMBILICAL HERNIA REPAIR N/A 01/26/2018   Procedure: UMBILICAL HERNIA REPAIR;  Surgeon: Clovis Riley, MD;  Location: [facility];  Service: General;  Laterality: N/A;     OB History   None      Home Medications    Prior to Admission medications   Medication Sig Start Date End Date Taking? Authorizing Provider  acetaminophen (TYLENOL) 325 MG tablet Take 2 tablets (650 mg total) by mouth every 6 (six) hours as needed. 01/26/18   Clovis Riley, MD  ALPRAZolam Duanne Moron) 0.5 MG tablet Take 0.5 mg by mouth daily as needed for anxiety.  12/26/15   [provider]  Black Cohosh 80 MG CAPS Take 80 mg by mouth daily.    [provider]  Black Cohosh-SoyIsoflav-Magnol (ESTROVEN MENOPAUSE RELIEF PO) Take 1 tablet by mouth daily.    [provider]  Continuous  Blood Gluc Transmit (DEXCOM G6 TRANSMITTER) MISC 1 each by Does not apply route daily.    [provider]  cyclobenzaprine (FLEXERIL) 5 MG tablet Take 1 tablet (5 mg total) by mouth at bedtime as needed for muscle spasms. 05/03/18   [name], Pa V, PA-C  escitalopram (LEXAPRO) 20 MG tablet Take 20 mg by mouth daily. 11/23/15   [provider]  HYDROcodone-acetaminophen (NORCO/VICODIN) 5-325 MG tablet Take 1 tablet by mouth every 6 (six) hours as needed for moderate pain. 01/26/18   Clovis Riley, MD  ibuprofen (ADVIL) 200 MG tablet Take 3 tablets (600 mg total) by mouth every 6 (six) hours. 11/02/17   Milly Jakob, MD  Insulin Human (INSULIN PUMP) SOLN Inject  into the skin. Novolog    [provider]  losartan (COZAAR) 100 MG tablet Take 100 mg by mouth daily.    [provider]  losartan (COZAAR) 50 MG tablet Take 50 mg by mouth daily. 01/23/17   [provider]  meloxicam (MOBIC) 7.5 MG tablet Take 1 tablet (7.5 mg total) by mouth daily. 05/03/18   [name], Shamon V, PA-C  montelukast (SINGULAIR) 10 MG tablet Take 10 mg by mouth at bedtime.    [provider]  naproxen sodium (ALEVE) 220 MG tablet Take 220 mg by mouth 2 (two) times daily as needed (pain).    [provider]  NOVOLOG 100 UNIT/ML injection 25-30 Units. Inject into skin as directed VIA Pump 06/20/15   [provider]  ondansetron (ZOFRAN) 4 MG tablet Take 4 mg by mouth every 8 (eight) hours as needed for nausea or vomiting.    [provider]    Family History Family History  Problem Relation Age of Onset  . Colon cancer Maternal Grandfather   . Cancer Maternal Grandfather        colon  . Diabetes Mother   . Ovarian cancer Paternal Aunt   . Cancer Paternal Aunt        ovarian    Social History Social History   Tobacco Use  . Smoking status: Former Smoker    Last attempt to quit: 09/08/2008    Years since quitting: 9.6  . Smokeless tobacco: Never Used  Substance Use Topics  . Alcohol use: Yes    Comment: social  . Drug use: No     Allergies   Latex; Insulin glargine; Lubiprostone; and Metoclopramide hcl   Review of Systems Review of Systems  All other systems reviewed and are negative.    Physical Exam Updated Vital Signs BP (!) 144/87 (BP Location: Left Arm)   Pulse 97   Temp 98.4 F (36.9 C) (Oral)   Resp 16   Ht 1.575 m (5\' 2" )   Wt 71.7 kg   SpO2 99%   BMI 28.90 kg/m   Physical Exam  Constitutional: She is oriented to person, place, and time. She appears well-developed and well-nourished.  Non-toxic appearance. No distress.  HENT:  Head: Normocephalic and atraumatic.  Eyes: Pupils are  equal, round, and reactive to light. Conjunctivae, EOM and lids are normal.  Neck: Normal range of motion. Neck supple. No tracheal deviation present. No thyroid mass present.  Cardiovascular: Normal rate, regular rhythm and normal heart sounds. Exam reveals no gallop.  No murmur heard. Pulmonary/Chest: Effort normal and breath sounds normal. No stridor. No respiratory distress. She has no decreased breath sounds. She has no wheezes. She has no rhonchi. She has no rales.  Abdominal: Soft. Normal appearance and  bowel sounds are normal. She exhibits no distension. There is tenderness in the right upper quadrant. There is guarding. There is no rigidity, no rebound and no CVA tenderness.    Musculoskeletal: Normal range of motion. She exhibits no edema or tenderness.  Neurological: She is alert and oriented to person, place, and time. She has normal strength. No cranial nerve deficit or sensory deficit. GCS eye subscore is 4. GCS verbal subscore is 5. GCS motor subscore is 6.  Skin: Skin is warm and dry. No abrasion and no rash noted.  Psychiatric: She has a normal mood and affect. Her speech is normal and behavior is normal.  Nursing note and vitals reviewed.    ED Treatments / Results  Labs (all labs ordered are listed, but only abnormal results are displayed) Labs Reviewed  CBC WITH DIFFERENTIAL/PLATELET  COMPREHENSIVE METABOLIC PANEL  LIPASE, BLOOD    EKG None  Radiology Dg Ribs Unilateral W/chest Right  Result Date: 05/03/2018 CLINICAL DATA:  Right rib pain.  No injury. EXAM: RIGHT RIBS AND CHEST - 3+ VIEW COMPARISON:  Chest CT 03/27/2017 FINDINGS: No fracture or other bone lesions are seen involving the ribs. There is no evidence of pneumothorax or pleural effusion. Both lungs are clear. Heart size and mediastinal contours are within normal limits. IMPRESSION: Negative. Electronically Signed   By: Rolm Baptise M.D.   On: 05/03/2018 19:45    Procedures Procedures (including  critical care time)  Medications Ordered in ED Medications  0.9 %  sodium chloride infusion (has no administration in time range)  HYDROmorphone (DILAUDID) injection 1 mg (has no administration in time range)  ondansetron (ZOFRAN) injection 4 mg (has no administration in time range)     Initial Impression / Assessment and Plan / ED Course  I have reviewed the triage vital signs and the nursing notes.  Pertinent labs & imaging results that were available during my care of the patient were reviewed by me and considered in my medical decision making (see chart for details).     Patient medicated for pain here and now feels better.  Concern for possible gallbladder pathology and ultrasound was negative.  Pain is slightly pleuritic and CT of the chest was negative.  Patient has no liver hemangioma and will be given instructions for outpatient follow-up for that.  Final Clinical Impressions(s) / ED Diagnoses   Final diagnoses:  None    ED Discharge Orders    None       Lacretia Leigh, MD 05/05/18 1521

## 2018-05-06 ENCOUNTER — Other Ambulatory Visit (HOSPITAL_COMMUNITY): Payer: Self-pay | Admitting: Family Medicine

## 2018-05-06 DIAGNOSIS — Z6826 Body mass index (BMI) 26.0-26.9, adult: Secondary | ICD-10-CM | POA: Diagnosis not present

## 2018-05-06 DIAGNOSIS — R1011 Right upper quadrant pain: Secondary | ICD-10-CM

## 2018-05-07 ENCOUNTER — Ambulatory Visit (HOSPITAL_COMMUNITY)
Admission: RE | Admit: 2018-05-07 | Discharge: 2018-05-07 | Disposition: A | Discharge status: Home / Self Care | Payer: BLUE CROSS/BLUE SHIELD | Source: Ambulatory Visit | Attending: Family Medicine | Admitting: Family Medicine

## 2018-05-07 DIAGNOSIS — R1011 Right upper quadrant pain: Secondary | ICD-10-CM | POA: Diagnosis not present

## 2018-05-07 DIAGNOSIS — R948 Abnormal results of function studies of other organs and systems: Secondary | ICD-10-CM | POA: Diagnosis not present

## 2018-05-07 MED ORDER — TECHNETIUM TC 99M MEBROFENIN IV KIT
5.2000 | PACK | Freq: Once | INTRAVENOUS | Status: AC | PRN
  Administered 2018-05-07: 5.2 via INTRAVENOUS

## 2018-05-14 DIAGNOSIS — Z794 Long term (current) use of insulin: Secondary | ICD-10-CM | POA: Diagnosis not present

## 2018-05-14 DIAGNOSIS — E109 Type 1 diabetes mellitus without complications: Secondary | ICD-10-CM | POA: Diagnosis not present

## 2018-05-14 DIAGNOSIS — E1065 Type 1 diabetes mellitus with hyperglycemia: Secondary | ICD-10-CM | POA: Diagnosis not present

## 2018-05-17 ENCOUNTER — Encounter (HOSPITAL_COMMUNITY): Payer: BLUE CROSS/BLUE SHIELD

## 2018-05-20 ENCOUNTER — Ambulatory Visit: Payer: Self-pay | Admitting: Surgery

## 2018-05-20 DIAGNOSIS — K828 Other specified diseases of gallbladder: Secondary | ICD-10-CM | POA: Diagnosis not present

## 2018-05-20 NOTE — H&P (Signed)
Surgical H&P  CC: right upper quadrant pain  HPI: this is a very nice 52 year old woman well known to me following umbilical hernia repair back in May.  About 3 weeks ago she started to develop severe focal right subcostal upper abdominal pain which reads to her back.  This is aggravated by certain foods.  No associated nausea or vomiting.  No prior similar symptoms.  She went to urgent care where she had a chest x-ray that was normal and was diagnosed with a old muscle and was given medications to treat that.  2 days later she was having ongoing pain and went to the emergency room where an ultrasound did not demonstrate any gallstones or abnormalities besides the soft tissue abnormalities in the liver, the patient does have a known hemangioma, she then had a CT angiogram of her chest as well as abdomen and pelvis that did not demonstrate any concerning findings, and a CMP and CBC were within normal limits except for her blood glucose of 297.he followed up with her primary care doctor who ordered a HIDA scan which does demonstrate mildly decreased gallbladder ejection fraction of 28%. She continues to have pain although it is not so severe that acute her from work.  She reports a history of alternating diarrhea and constipation.  She does have a history of diabetic gastroparesis as well.  Allergies  Allergen Reactions  . Latex Anaphylaxis and Rash  . Lubiprostone Other (See Comments)    Built up water in RIght eye and could not see  . Metoclopramide Hcl Other (See Comments)    Causes her to lactate.  . Insulin Glargine Hives and Rash    Lantus    Past Medical History:  Diagnosis Date  . Anxiety   . Constipation   . Depression   . Heart murmur   . Hypertension   . 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 PFO (patent foramen ovale)    being followed by Dr Andrez Grime problems, no surgery  . Seizures ([place])    52 years old Blood sugar really low in [place]  .  Shingles   . TIA (transient ischemic attack)     Past Surgical History:  Procedure Laterality Date  . cervicle fusion    . FOREIGN BODY REMOVAL     right index finger  . HERNIA REPAIR     umbilical 15 76-LYYTK ago umbilical repair Dr. Kae Heller 01-26-18  . INSERTION OF MESH N/A 01/26/2018   Procedure: INSERTION OF MESH;  Surgeon: Clovis Riley, MD;  Location: [facility];  Service: General;  Laterality: N/A;  . TRIGGER FINGER RELEASE Right   . TRIGGER FINGER RELEASE Left 11/02/2017   Procedure: LEFT THUMB TRIGGER DIGIT RELEASE;  Surgeon: Milly Jakob, MD;  Location: [facility];  Service: Orthopedics;  Laterality: Left;  . UMBILICAL HERNIA REPAIR N/A 01/26/2018   Procedure: UMBILICAL HERNIA REPAIR;  Surgeon: Clovis Riley, MD;  Location: [facility];  Service: General;  Laterality: N/A;    Family History  Problem Relation Age of Onset  . Colon cancer Maternal Grandfather   . Cancer Maternal Grandfather        colon  . Diabetes Mother   . Ovarian cancer Paternal Aunt   . Cancer Paternal Aunt        ovarian    Social History   Socioeconomic History  . Marital status: Single    Spouse name: Not on file  .  Number of children: 0  . Years of education: Not on file  . Highest education level: Not on file  Occupational History  . Occupation: Recruitment consultant  Social Needs  . Financial resource strain: Not on file  . Food insecurity:    Worry: Not on file    Inability: Not on file  . Transportation needs:    Medical: Not on file    Non-medical: Not on file  Tobacco Use  . Smoking status: Former Smoker    Last attempt to quit: 09/08/2008    Years since quitting: 9.7  . Smokeless tobacco: Never Used  Substance and Sexual Activity  . Alcohol use: Yes    Comment: social  . Drug use: No  . Sexual activity: Not Currently  Lifestyle  . Physical activity:    Days per week: Not on file    Minutes per session: Not on file  . Stress: Not on file  Relationships  .  Social connections:    Talks on phone: Not on file    Gets together: Not on file    Attends religious service: Not on file    Active member of club or organization: Not on file    Attends meetings of clubs or organizations: Not on file    Relationship status: Not on file  Other Topics Concern  . Not on file  Social History Narrative   2-3 cups caffeine daily    Current Outpatient Medications on File Prior to Visit  Medication Sig Dispense Refill  . acetaminophen (TYLENOL) 325 MG tablet Take 2 tablets (650 mg total) by mouth every 6 (six) hours as needed. (Patient not taking: Reported on 05/05/2018)    . ALPRAZolam (XANAX) 0.5 MG tablet Take 0.5 mg by mouth daily as needed for anxiety.   0  . aspirin 81 MG chewable tablet Chew 162 mg by mouth daily.    . Continuous Blood Gluc Transmit (DEXCOM G6 TRANSMITTER) MISC 1 each by Does not apply route daily.    . cyclobenzaprine (FLEXERIL) 5 MG tablet Take 1 tablet (5 mg total) by mouth at bedtime as needed for muscle spasms. 10 tablet 0  . diazepam (VALIUM) 2 MG tablet Take 1 tablet (2 mg total) by mouth every 6 (six) hours as needed for anxiety or muscle spasms. 15 tablet 0  . escitalopram (LEXAPRO) 20 MG tablet Take 20 mg by mouth daily.  0  . HYDROcodone-acetaminophen (NORCO/VICODIN) 5-325 MG tablet Take 1 tablet by mouth every 6 (six) hours as needed for moderate pain. (Patient not taking: Reported on 05/05/2018) 20 tablet 0  . HYDROcodone-acetaminophen (NORCO/VICODIN) 5-325 MG tablet Take 1-2 tablets by mouth every 4 (four) hours as needed. 10 tablet 0  . ibuprofen (ADVIL) 200 MG tablet Take 3 tablets (600 mg total) by mouth every 6 (six) hours. (Patient not taking: Reported on 05/05/2018)  0  . Insulin Human (INSULIN PUMP) SOLN Inject into the skin. Novolog    . losartan (COZAAR) 100 MG tablet Take 100 mg by mouth daily.    . meloxicam (MOBIC) 7.5 MG tablet Take 1 tablet (7.5 mg total) by mouth daily. 15 tablet 0  . montelukast (SINGULAIR) 10  MG tablet Take 10 mg by mouth at bedtime as needed (sneezing).     . naproxen sodium (ALEVE) 220 MG tablet Take 440 mg by mouth daily as needed (pain).     . NOVOLOG 100 UNIT/ML injection 25-30 Units. Inject into skin as directed VIA Pump  1  .  Polyethyl Glycol-Propyl Glycol (SYSTANE OP) Place 2-3 drops into both eyes at bedtime as needed for dry eyes.     No current facility-administered medications on file prior to visit.     Review of Systems: a complete, 10pt review of systems was completed with pertinent positives and negatives as documented in the HPI  Physical Exam: There were no vitals filed for this visit. Gen: A&Ox3, no distress  Head: normocephalic, atraumatic Eyes: extraocular motions intact, anicteric.  Neck: supple without mass or thyromegaly Chest: unlabored respirations, symmetrical air entry, clear bilaterally   Cardiovascular: RRR with palpable distal pulses, no pedal edema Abdomen: soft, nondistended, nontender. No mass or organomegaly.  Extremities: warm, without edema, no deformities  Neuro: grossly intact Psych: appropriate mood and affect, normal insight  Skin: warm and dry   CBC Latest Ref Rng & Units 05/05/2018 01/22/2018 01/08/2018  WBC 4.0 - 10.5 K/uL 7.7 7.3 5.8  Hemoglobin 12.0 - 15.0 g/dL 14.4 13.7 14.2  Hematocrit 36.0 - 46.0 % 41.5 42.1 42.1  Platelets 150 - 400 K/uL 271 276 277    CMP Latest Ref Rng & Units 05/05/2018 01/08/2018 10/30/2017  Glucose 70 - 99 mg/dL 297(H) 145(H) 102(H)  BUN 6 - 20 mg/dL 15 15 11   Creatinine 0.44 - 1.00 mg/dL 0.79 0.70 0.78  Sodium 135 - 145 mmol/L 136 138 139  Potassium 3.5 - 5.1 mmol/L 4.5 3.9 5.4(H)  Chloride 98 - 111 mmol/L 97(L) 102 103  CO2 22 - 32 mmol/L 27 25 25   Calcium 8.9 - 10.3 mg/dL 9.4 8.8(L) 9.2  Total Protein 6.5 - 8.1 g/dL 7.8 7.7 -  Total Bilirubin 0.3 - 1.2 mg/dL 0.8 0.9 -  Alkaline Phos 38 - 126 U/L 72 72 -  AST 15 - 41 U/L 22 29 -  ALT 0 - 44 U/L 22 40 -    Lab Results  Component Value Date    INR 1.0 07/25/2008    Imaging: No results found.   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 Discussed that her location of pain and aggravation by eating are consistent with gallbladder etiology. I recommend proceeding with laparoscopic cholecystectomy. We discussed the technique of the surgery again using the anatomy diagram to demonstrate. Discussed risks of surgery including bleeding, pain, scarring, intraabdominal injury specifically to the common bile duct and sequelae, conversion to open surgery, failure to resolve symptoms, blood clots/ pulmonary embolus, heart attack, pneumonia, stroke, death. Questions welcomed and answered to patient's satisfaction. She would like to proceed with surgery.   Romana Juniper, MD [facility] Surgery, [place] Pager [phone]

## 2018-05-20 NOTE — H&P (View-Only) (Signed)
Surgical H&P  CC: right upper quadrant pain  HPI: this is a very nice 52 year old woman well known to me following umbilical hernia repair back in May.  About 3 weeks ago she started to develop severe focal right subcostal upper abdominal pain which reads to her back.  This is aggravated by certain foods.  No associated nausea or vomiting.  No prior similar symptoms.  She went to urgent care where she had a chest x-ray that was normal and was diagnosed with a old muscle and was given medications to treat that.  2 days later she was having ongoing pain and went to the emergency room where an ultrasound did not demonstrate any gallstones or abnormalities besides the soft tissue abnormalities in the liver, the patient does have a known hemangioma, she then had a CT angiogram of her chest as well as abdomen and pelvis that did not demonstrate any concerning findings, and a CMP and CBC were within normal limits except for her blood glucose of 297.he followed up with her primary care doctor who ordered a HIDA scan which does demonstrate mildly decreased gallbladder ejection fraction of 28%. She continues to have pain although it is not so severe that acute her from work.  She reports a history of alternating diarrhea and constipation.  She does have a history of diabetic gastroparesis as well.  Allergies  Allergen Reactions  . Latex Anaphylaxis and Rash  . Lubiprostone Other (See Comments)    Built up water in RIght eye and could not see  . Metoclopramide Hcl Other (See Comments)    Causes her to lactate.  . Insulin Glargine Hives and Rash    Lantus    Past Medical History:  Diagnosis Date  . Anxiety   . Constipation   . Depression   . Heart murmur   . Hypertension   . 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 PFO (patent foramen ovale)    being followed by Dr Andrez Grime problems, no surgery  . Seizures ([place])    52 years old Blood sugar really low in [place]  .  Shingles   . TIA (transient ischemic attack)     Past Surgical History:  Procedure Laterality Date  . cervicle fusion    . FOREIGN BODY REMOVAL     right index finger  . HERNIA REPAIR     umbilical 15 22-GURKY ago umbilical repair Dr. Kae Heller 01-26-18  . INSERTION OF MESH N/A 01/26/2018   Procedure: INSERTION OF MESH;  Surgeon: Clovis Riley, MD;  Location: [facility];  Service: General;  Laterality: N/A;  . TRIGGER FINGER RELEASE Right   . TRIGGER FINGER RELEASE Left 11/02/2017   Procedure: LEFT THUMB TRIGGER DIGIT RELEASE;  Surgeon: Milly Jakob, MD;  Location: [facility];  Service: Orthopedics;  Laterality: Left;  . UMBILICAL HERNIA REPAIR N/A 01/26/2018   Procedure: UMBILICAL HERNIA REPAIR;  Surgeon: Clovis Riley, MD;  Location: [facility];  Service: General;  Laterality: N/A;    Family History  Problem Relation Age of Onset  . Colon cancer Maternal Grandfather   . Cancer Maternal Grandfather        colon  . Diabetes Mother   . Ovarian cancer Paternal Aunt   . Cancer Paternal Aunt        ovarian    Social History   Socioeconomic History  . Marital status: Single    Spouse name: Not on file  .  Number of children: 0  . Years of education: Not on file  . Highest education level: Not on file  Occupational History  . Occupation: Recruitment consultant  Social Needs  . Financial resource strain: Not on file  . Food insecurity:    Worry: Not on file    Inability: Not on file  . Transportation needs:    Medical: Not on file    Non-medical: Not on file  Tobacco Use  . Smoking status: Former Smoker    Last attempt to quit: 09/08/2008    Years since quitting: 9.7  . Smokeless tobacco: Never Used  Substance and Sexual Activity  . Alcohol use: Yes    Comment: social  . Drug use: No  . Sexual activity: Not Currently  Lifestyle  . Physical activity:    Days per week: Not on file    Minutes per session: Not on file  . Stress: Not on file  Relationships  .  Social connections:    Talks on phone: Not on file    Gets together: Not on file    Attends religious service: Not on file    Active member of club or organization: Not on file    Attends meetings of clubs or organizations: Not on file    Relationship status: Not on file  Other Topics Concern  . Not on file  Social History Narrative   2-3 cups caffeine daily    Current Outpatient Medications on File Prior to Visit  Medication Sig Dispense Refill  . acetaminophen (TYLENOL) 325 MG tablet Take 2 tablets (650 mg total) by mouth every 6 (six) hours as needed. (Patient not taking: Reported on 05/05/2018)    . ALPRAZolam (XANAX) 0.5 MG tablet Take 0.5 mg by mouth daily as needed for anxiety.   0  . aspirin 81 MG chewable tablet Chew 162 mg by mouth daily.    . Continuous Blood Gluc Transmit (DEXCOM G6 TRANSMITTER) MISC 1 each by Does not apply route daily.    . cyclobenzaprine (FLEXERIL) 5 MG tablet Take 1 tablet (5 mg total) by mouth at bedtime as needed for muscle spasms. 10 tablet 0  . diazepam (VALIUM) 2 MG tablet Take 1 tablet (2 mg total) by mouth every 6 (six) hours as needed for anxiety or muscle spasms. 15 tablet 0  . escitalopram (LEXAPRO) 20 MG tablet Take 20 mg by mouth daily.  0  . HYDROcodone-acetaminophen (NORCO/VICODIN) 5-325 MG tablet Take 1 tablet by mouth every 6 (six) hours as needed for moderate pain. (Patient not taking: Reported on 05/05/2018) 20 tablet 0  . HYDROcodone-acetaminophen (NORCO/VICODIN) 5-325 MG tablet Take 1-2 tablets by mouth every 4 (four) hours as needed. 10 tablet 0  . ibuprofen (ADVIL) 200 MG tablet Take 3 tablets (600 mg total) by mouth every 6 (six) hours. (Patient not taking: Reported on 05/05/2018)  0  . Insulin Human (INSULIN PUMP) SOLN Inject into the skin. Novolog    . losartan (COZAAR) 100 MG tablet Take 100 mg by mouth daily.    . meloxicam (MOBIC) 7.5 MG tablet Take 1 tablet (7.5 mg total) by mouth daily. 15 tablet 0  . montelukast (SINGULAIR) 10  MG tablet Take 10 mg by mouth at bedtime as needed (sneezing).     . naproxen sodium (ALEVE) 220 MG tablet Take 440 mg by mouth daily as needed (pain).     . NOVOLOG 100 UNIT/ML injection 25-30 Units. Inject into skin as directed VIA Pump  1  .  Polyethyl Glycol-Propyl Glycol (SYSTANE OP) Place 2-3 drops into both eyes at bedtime as needed for dry eyes.     No current facility-administered medications on file prior to visit.     Review of Systems: a complete, 10pt review of systems was completed with pertinent positives and negatives as documented in the HPI  Physical Exam: There were no vitals filed for this visit. Gen: A&Ox3, no distress  Head: normocephalic, atraumatic Eyes: extraocular motions intact, anicteric.  Neck: supple without mass or thyromegaly Chest: unlabored respirations, symmetrical air entry, clear bilaterally   Cardiovascular: RRR with palpable distal pulses, no pedal edema Abdomen: soft, nondistended, nontender. No mass or organomegaly.  Extremities: warm, without edema, no deformities  Neuro: grossly intact Psych: appropriate mood and affect, normal insight  Skin: warm and dry   CBC Latest Ref Rng & Units 05/05/2018 01/22/2018 01/08/2018  WBC 4.0 - 10.5 K/uL 7.7 7.3 5.8  Hemoglobin 12.0 - 15.0 g/dL 14.4 13.7 14.2  Hematocrit 36.0 - 46.0 % 41.5 42.1 42.1  Platelets 150 - 400 K/uL 271 276 277    CMP Latest Ref Rng & Units 05/05/2018 01/08/2018 10/30/2017  Glucose 70 - 99 mg/dL 297(H) 145(H) 102(H)  BUN 6 - 20 mg/dL 15 15 11   Creatinine 0.44 - 1.00 mg/dL 0.79 0.70 0.78  Sodium 135 - 145 mmol/L 136 138 139  Potassium 3.5 - 5.1 mmol/L 4.5 3.9 5.4(H)  Chloride 98 - 111 mmol/L 97(L) 102 103  CO2 22 - 32 mmol/L 27 25 25   Calcium 8.9 - 10.3 mg/dL 9.4 8.8(L) 9.2  Total Protein 6.5 - 8.1 g/dL 7.8 7.7 -  Total Bilirubin 0.3 - 1.2 mg/dL 0.8 0.9 -  Alkaline Phos 38 - 126 U/L 72 72 -  AST 15 - 41 U/L 22 29 -  ALT 0 - 44 U/L 22 40 -    Lab Results  Component Value Date    INR 1.0 07/25/2008    Imaging: No results found.   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 Discussed that her location of pain and aggravation by eating are consistent with gallbladder etiology. I recommend proceeding with laparoscopic cholecystectomy. We discussed the technique of the surgery again using the anatomy diagram to demonstrate. Discussed risks of surgery including bleeding, pain, scarring, intraabdominal injury specifically to the common bile duct and sequelae, conversion to open surgery, failure to resolve symptoms, blood clots/ pulmonary embolus, heart attack, pneumonia, stroke, death. Questions welcomed and answered to patient's satisfaction. She would like to proceed with surgery.   Romana Juniper, MD [facility], [place] Pager [phone]

## 2018-05-21 ENCOUNTER — Other Ambulatory Visit: Payer: Self-pay

## 2018-05-21 ENCOUNTER — Encounter (HOSPITAL_COMMUNITY): Payer: Self-pay | Admitting: *Deleted

## 2018-05-21 NOTE — Progress Notes (Signed)
Patient has Insulin Pump and continuous glucose monitoring device.  Contacted Morrison Old , RN , Diabetic Coordinator in regards to Insulin Pump Recommendations for surgery.  Surgery is from 1030am-1230pm on 05/24/2018 with arrival of 0800am.  Patient states with hernia surgery in 01/2018 she left Insulin Pump settings the same and did fine.  Last HGB A1C- 03/2018 per patient was 8.1? Morrison Old , RN made aware of all of above.  Diabetic Coordinator, Morrison Old was to leave Insulin Pump settings the same.  Notified patient and she voiced understanding.

## 2018-05-23 MED ORDER — BUPIVACAINE LIPOSOME 1.3 % IJ SUSP
20.0000 mL | Freq: Once | INTRAMUSCULAR | Status: DC
  Filled 2018-05-23: qty 20

## 2018-05-24 ENCOUNTER — Ambulatory Visit (HOSPITAL_COMMUNITY)
Admission: RE | Admit: 2018-05-24 | Discharge: 2018-05-24 | Disposition: A | Discharge status: Home / Self Care | Payer: BLUE CROSS/BLUE SHIELD | Source: Ambulatory Visit | Attending: Surgery | Admitting: Surgery

## 2018-05-24 ENCOUNTER — Encounter (HOSPITAL_COMMUNITY)
Admission: RE | Disposition: A | Discharge status: Home / Self Care | Payer: Self-pay | Source: Ambulatory Visit | Attending: Surgery

## 2018-05-24 ENCOUNTER — Other Ambulatory Visit: Payer: Self-pay

## 2018-05-24 ENCOUNTER — Encounter (HOSPITAL_COMMUNITY): Payer: Self-pay | Admitting: Emergency Medicine

## 2018-05-24 ENCOUNTER — Ambulatory Visit (HOSPITAL_COMMUNITY): Payer: BLUE CROSS/BLUE SHIELD | Admitting: Anesthesiology

## 2018-05-24 DIAGNOSIS — K811 Chronic cholecystitis: Secondary | ICD-10-CM | POA: Diagnosis not present

## 2018-05-24 DIAGNOSIS — Z87891 Personal history of nicotine dependence: Secondary | ICD-10-CM | POA: Insufficient documentation

## 2018-05-24 DIAGNOSIS — F419 Anxiety disorder, unspecified: Secondary | ICD-10-CM | POA: Insufficient documentation

## 2018-05-24 DIAGNOSIS — K660 Peritoneal adhesions (postprocedural) (postinfection): Secondary | ICD-10-CM | POA: Diagnosis not present

## 2018-05-24 DIAGNOSIS — Z794 Long term (current) use of insulin: Secondary | ICD-10-CM | POA: Insufficient documentation

## 2018-05-24 DIAGNOSIS — E109 Type 1 diabetes mellitus without complications: Secondary | ICD-10-CM | POA: Diagnosis not present

## 2018-05-24 DIAGNOSIS — I10 Essential (primary) hypertension: Secondary | ICD-10-CM | POA: Insufficient documentation

## 2018-05-24 DIAGNOSIS — E1043 Type 1 diabetes mellitus with diabetic autonomic (poly)neuropathy: Secondary | ICD-10-CM | POA: Diagnosis not present

## 2018-05-24 DIAGNOSIS — F329 Major depressive disorder, single episode, unspecified: Secondary | ICD-10-CM | POA: Insufficient documentation

## 2018-05-24 DIAGNOSIS — Z79899 Other long term (current) drug therapy: Secondary | ICD-10-CM | POA: Insufficient documentation

## 2018-05-24 DIAGNOSIS — Z7982 Long term (current) use of aspirin: Secondary | ICD-10-CM | POA: Diagnosis not present

## 2018-05-24 DIAGNOSIS — K828 Other specified diseases of gallbladder: Secondary | ICD-10-CM | POA: Diagnosis not present

## 2018-05-24 DIAGNOSIS — Z8673 Personal history of transient ischemic attack (TIA), and cerebral infarction without residual deficits: Secondary | ICD-10-CM | POA: Insufficient documentation

## 2018-05-24 DIAGNOSIS — K3184 Gastroparesis: Secondary | ICD-10-CM | POA: Diagnosis not present

## 2018-05-24 DIAGNOSIS — R1011 Right upper quadrant pain: Secondary | ICD-10-CM | POA: Diagnosis not present

## 2018-05-24 HISTORY — DX: Nausea with vomiting, unspecified: R11.2

## 2018-05-24 HISTORY — PX: CHOLECYSTECTOMY: SHX55

## 2018-05-24 HISTORY — DX: Cerebral infarction, unspecified: I63.9

## 2018-05-24 HISTORY — DX: Other specified postprocedural states: Z98.890

## 2018-05-24 LAB — GLUCOSE, CAPILLARY
GLUCOSE-CAPILLARY: 147 mg/dL — AB (ref 70–99)
Glucose-Capillary: 95 mg/dL (ref 70–99)
Glucose-Capillary: 127 mg/dL — ABNORMAL HIGH (ref 70–99)

## 2018-05-24 SURGERY — LAPAROSCOPIC CHOLECYSTECTOMY
Anesthesia: General

## 2018-05-24 MED ORDER — OXYCODONE HCL 5 MG PO TABS
5.0000 mg | ORAL_TABLET | ORAL | Status: DC | PRN
  Administered 2018-05-24: 5 mg via ORAL

## 2018-05-24 MED ORDER — BUPIVACAINE-EPINEPHRINE (PF) 0.25% -1:200000 IJ SOLN
INTRAMUSCULAR | Status: AC
  Filled 2018-05-24: qty 30

## 2018-05-24 MED ORDER — LACTATED RINGERS IR SOLN
Status: DC | PRN
  Administered 2018-05-24: 1000 mL

## 2018-05-24 MED ORDER — ROCURONIUM BROMIDE 10 MG/ML (PF) SYRINGE
PREFILLED_SYRINGE | INTRAVENOUS | Status: DC | PRN
  Administered 2018-05-24 (×2): 50 mg via INTRAVENOUS

## 2018-05-24 MED ORDER — FENTANYL CITRATE (PF) 100 MCG/2ML IJ SOLN: 25.0000 ug | INTRAMUSCULAR | Status: DC | PRN

## 2018-05-24 MED ORDER — ONDANSETRON HCL 4 MG/2ML IJ SOLN
INTRAMUSCULAR | Status: DC | PRN
  Administered 2018-05-24: 4 mg via INTRAVENOUS

## 2018-05-24 MED ORDER — FENTANYL CITRATE (PF) 250 MCG/5ML IJ SOLN
INTRAMUSCULAR | Status: DC | PRN
  Administered 2018-05-24: 100 ug via INTRAVENOUS
  Administered 2018-05-24: 50 ug via INTRAVENOUS
  Administered 2018-05-24: 100 ug via INTRAVENOUS

## 2018-05-24 MED ORDER — LIDOCAINE 2% (20 MG/ML) 5 ML SYRINGE
INTRAMUSCULAR | Status: AC
  Filled 2018-05-24: qty 5

## 2018-05-24 MED ORDER — SODIUM CHLORIDE 0.9% FLUSH: 3.0000 mL | Freq: Two times a day (BID) | INTRAVENOUS | Status: DC

## 2018-05-24 MED ORDER — PROPOFOL 10 MG/ML IV BOLUS
INTRAVENOUS | Status: AC
  Filled 2018-05-24: qty 20

## 2018-05-24 MED ORDER — LACTATED RINGERS IV SOLN
INTRAVENOUS | Status: DC
  Administered 2018-05-24 (×2): via INTRAVENOUS

## 2018-05-24 MED ORDER — SODIUM CHLORIDE 0.9 % IV SOLN: 250.0000 mL | INTRAVENOUS | Status: DC | PRN

## 2018-05-24 MED ORDER — DEXAMETHASONE SODIUM PHOSPHATE 10 MG/ML IJ SOLN
INTRAMUSCULAR | Status: DC | PRN
  Administered 2018-05-24: 4 mg via INTRAVENOUS

## 2018-05-24 MED ORDER — FENTANYL CITRATE (PF) 100 MCG/2ML IJ SOLN
INTRAMUSCULAR | Status: AC
  Filled 2018-05-24: qty 4

## 2018-05-24 MED ORDER — ACETAMINOPHEN 500 MG PO TABS
1000.0000 mg | ORAL_TABLET | ORAL | Status: AC
  Administered 2018-05-24: 1000 mg via ORAL
  Filled 2018-05-24: qty 2

## 2018-05-24 MED ORDER — MEPERIDINE HCL 50 MG/ML IJ SOLN: 6.2500 mg | INTRAMUSCULAR | Status: DC | PRN

## 2018-05-24 MED ORDER — ACETAMINOPHEN 325 MG PO TABS: 650.0000 mg | ORAL_TABLET | ORAL | Status: DC | PRN

## 2018-05-24 MED ORDER — MIDAZOLAM HCL 2 MG/2ML IJ SOLN
INTRAMUSCULAR | Status: AC
  Filled 2018-05-24: qty 2

## 2018-05-24 MED ORDER — DOCUSATE SODIUM 100 MG PO CAPS: 100.0000 mg | ORAL_CAPSULE | Freq: Two times a day (BID) | ORAL | 0 refills | Status: AC

## 2018-05-24 MED ORDER — FENTANYL CITRATE (PF) 100 MCG/2ML IJ SOLN
INTRAMUSCULAR | Status: AC
  Filled 2018-05-24: qty 2

## 2018-05-24 MED ORDER — LIDOCAINE 2% (20 MG/ML) 5 ML SYRINGE
INTRAMUSCULAR | Status: DC | PRN
  Administered 2018-05-24: 100 mg via INTRAVENOUS

## 2018-05-24 MED ORDER — ONDANSETRON HCL 4 MG/2ML IJ SOLN
INTRAMUSCULAR | Status: AC
  Filled 2018-05-24: qty 2

## 2018-05-24 MED ORDER — MIDAZOLAM HCL 2 MG/2ML IJ SOLN
INTRAMUSCULAR | Status: DC | PRN
  Administered 2018-05-24: 2 mg via INTRAVENOUS

## 2018-05-24 MED ORDER — ROCURONIUM BROMIDE 10 MG/ML (PF) SYRINGE
PREFILLED_SYRINGE | INTRAVENOUS | Status: AC
  Filled 2018-05-24: qty 10

## 2018-05-24 MED ORDER — ACETAMINOPHEN 650 MG RE SUPP
650.0000 mg | RECTAL | Status: DC | PRN
  Filled 2018-05-24: qty 1

## 2018-05-24 MED ORDER — FENTANYL CITRATE (PF) 250 MCG/5ML IJ SOLN
INTRAMUSCULAR | Status: AC
  Filled 2018-05-24: qty 5

## 2018-05-24 MED ORDER — GABAPENTIN 300 MG PO CAPS
300.0000 mg | ORAL_CAPSULE | ORAL | Status: AC
  Administered 2018-05-24: 300 mg via ORAL
  Filled 2018-05-24: qty 1

## 2018-05-24 MED ORDER — BUPIVACAINE-EPINEPHRINE 0.25% -1:200000 IJ SOLN
INTRAMUSCULAR | Status: DC | PRN
  Administered 2018-05-24: 30 mL

## 2018-05-24 MED ORDER — CHLORHEXIDINE GLUCONATE 4 % EX LIQD: 60.0000 mL | Freq: Once | CUTANEOUS | Status: DC

## 2018-05-24 MED ORDER — PROPOFOL 10 MG/ML IV BOLUS
INTRAVENOUS | Status: DC | PRN
  Administered 2018-05-24: 150 mg via INTRAVENOUS

## 2018-05-24 MED ORDER — 0.9 % SODIUM CHLORIDE (POUR BTL) OPTIME
TOPICAL | Status: DC | PRN
  Administered 2018-05-24: 1000 mL

## 2018-05-24 MED ORDER — CEFAZOLIN SODIUM-DEXTROSE 2-4 GM/100ML-% IV SOLN
2.0000 g | INTRAVENOUS | Status: AC
  Administered 2018-05-24: 2 g via INTRAVENOUS
  Filled 2018-05-24: qty 100

## 2018-05-24 MED ORDER — SUGAMMADEX SODIUM 200 MG/2ML IV SOLN
INTRAVENOUS | Status: DC | PRN
  Administered 2018-05-24: 150 mg via INTRAVENOUS

## 2018-05-24 MED ORDER — PROMETHAZINE HCL 25 MG/ML IJ SOLN: 6.2500 mg | INTRAMUSCULAR | Status: DC | PRN

## 2018-05-24 MED ORDER — DEXAMETHASONE SODIUM PHOSPHATE 10 MG/ML IJ SOLN
INTRAMUSCULAR | Status: AC
  Filled 2018-05-24: qty 1

## 2018-05-24 MED ORDER — OXYCODONE HCL 5 MG PO TABS
ORAL_TABLET | ORAL | Status: AC
  Filled 2018-05-24: qty 1

## 2018-05-24 MED ORDER — OXYCODONE-ACETAMINOPHEN 5-325 MG PO TABS: 1.0000 | ORAL_TABLET | Freq: Four times a day (QID) | ORAL | 0 refills | Status: DC | PRN

## 2018-05-24 MED ORDER — SODIUM CHLORIDE 0.9% FLUSH: 3.0000 mL | INTRAVENOUS | Status: DC | PRN

## 2018-05-24 MED ORDER — FENTANYL CITRATE (PF) 100 MCG/2ML IJ SOLN
25.0000 ug | INTRAMUSCULAR | Status: AC | PRN
  Administered 2018-05-24 (×2): 50 ug via INTRAVENOUS
  Administered 2018-05-24: 13:00:00 via INTRAVENOUS
  Administered 2018-05-24 (×3): 50 ug via INTRAVENOUS

## 2018-05-24 MED ORDER — ROCURONIUM BROMIDE 10 MG/ML (PF) SYRINGE
PREFILLED_SYRINGE | INTRAVENOUS | Status: AC
  Filled 2018-05-24: qty 20

## 2018-05-24 SURGICAL SUPPLY — 38 items
ADH SKN CLS APL DERMABOND .7 (GAUZE/BANDAGES/DRESSINGS) ×1
APPLIER CLIP ROT 10 11.4 M/L (STAPLE) ×2
APR CLP MED LRG 11.4X10 (STAPLE) ×1
BAG SPEC RTRVL LRG 6X4 10 (ENDOMECHANICALS) ×1
CABLE HIGH FREQUENCY MONO STRZ (ELECTRODE) ×2 IMPLANT
CHLORAPREP W/TINT 26ML (MISCELLANEOUS) ×2 IMPLANT
CLIP APPLIE ROT 10 11.4 M/L (STAPLE) ×1 IMPLANT
COVER MAYO STAND STRL (DRAPES) IMPLANT
COVER SURGICAL LIGHT HANDLE (MISCELLANEOUS) ×2 IMPLANT
DECANTER SPIKE VIAL GLASS SM (MISCELLANEOUS) ×2 IMPLANT
DERMABOND ADVANCED (GAUZE/BANDAGES/DRESSINGS) ×1
DERMABOND ADVANCED .7 DNX12 (GAUZE/BANDAGES/DRESSINGS) ×1 IMPLANT
DRAPE C-ARM 42X120 X-RAY (DRAPES) IMPLANT
ELECT REM PT RETURN 15FT ADLT (MISCELLANEOUS) ×2 IMPLANT
ENDOLOOP SUT PDS II  0 18 (SUTURE) ×1
ENDOLOOP SUT PDS II 0 18 (SUTURE) ×1 IMPLANT
GLOVE BIO SURGEON STRL SZ 6 (GLOVE) IMPLANT
GLOVE INDICATOR 6.5 STRL GRN (GLOVE) ×2 IMPLANT
GLOVE SURG SS PI 6.5 STRL IVOR (GLOVE) ×4 IMPLANT
GLOVE SURG SS PI 7.0 STRL IVOR (GLOVE) ×2 IMPLANT
GOWN STRL REUS W/TWL LRG LVL3 (GOWN DISPOSABLE) ×2 IMPLANT
GOWN STRL REUS W/TWL XL LVL3 (GOWN DISPOSABLE) ×4 IMPLANT
GRASPER SUT TROCAR 14GX15 (MISCELLANEOUS) ×2 IMPLANT
HEMOSTAT SNOW SURGICEL 2X4 (HEMOSTASIS) IMPLANT
KIT BASIN OR (CUSTOM PROCEDURE TRAY) ×2 IMPLANT
NEEDLE INSUFFLATION 14GA 120MM (NEEDLE) ×2 IMPLANT
POUCH SPECIMEN RETRIEVAL 10MM (ENDOMECHANICALS) ×2 IMPLANT
SCISSORS LAP 5X35 DISP (ENDOMECHANICALS) ×2 IMPLANT
SET CHOLANGIOGRAPH MIX (MISCELLANEOUS) IMPLANT
SET IRRIG TUBING LAPAROSCOPIC (IRRIGATION / IRRIGATOR) ×2 IMPLANT
SLEEVE XCEL OPT CAN 5 100 (ENDOMECHANICALS) ×4 IMPLANT
SUT MNCRL AB 4-0 PS2 18 (SUTURE) ×2 IMPLANT
TOWEL OR 17X26 10 PK STRL BLUE (TOWEL DISPOSABLE) ×2 IMPLANT
TOWEL OR NON WOVEN STRL DISP B (DISPOSABLE) ×2 IMPLANT
TRAY LAPAROSCOPIC (CUSTOM PROCEDURE TRAY) ×2 IMPLANT
TROCAR BLADELESS OPT 5 100 (ENDOMECHANICALS) ×2 IMPLANT
TROCAR XCEL 12X100 BLDLESS (ENDOMECHANICALS) ×2 IMPLANT
TUBING INSUF HEATED (TUBING) ×2 IMPLANT

## 2018-05-24 NOTE — Progress Notes (Addendum)
Dr. Lissa Hoard notified that pt is perimenopausal, has not had a period in 6 months and does not wish to have a pregnancy test prior to surgery. Pt states there is no chance she is pregnant.    Dr. Lissa Hoard notified that diabetes coordinator spoke to pre-op nurse and told her that she could keep insulin pump on during surgery. Dr. Lissa Hoard agrees with this plan and pt updated.

## 2018-05-24 NOTE — Anesthesia Preprocedure Evaluation (Signed)
Anesthesia Evaluation  Patient identified by MRN, date of birth, ID band Patient awake    Reviewed: Allergy & Precautions, NPO status , Patient's Chart, lab work & pertinent test results  History of Anesthesia Complications (+) PONV and history of anesthetic complications  Airway Mallampati: I  TM Distance: >3 FB Neck ROM: Full    Dental  (+) Dental Advisory Given   Pulmonary shortness of breath, former smoker,    Pulmonary exam normal        Cardiovascular hypertension, Pt. on medications Normal cardiovascular exam+ Valvular Problems/Murmurs  Rhythm:Regular Rate:Normal  H/O PFO   Neuro/Psych Seizures -, Well Controlled,  PSYCHIATRIC DISORDERS Anxiety Depression TIACVA    GI/Hepatic   Endo/Other  diabetes, Type 1, Insulin Dependent  Renal/GU      Musculoskeletal   Abdominal   Peds  Hematology   Anesthesia Other Findings   Reproductive/Obstetrics                             Anesthesia Physical  Anesthesia Plan  ASA: III  Anesthesia Plan: General   Post-op Pain Management:    Induction: Intravenous  PONV Risk Score and Plan: 4 or greater and Ondansetron, Midazolam, Treatment may vary due to age or medical condition and Propofol infusion  Airway Management Planned: Oral ETT  Additional Equipment:   Intra-op Plan:   Post-operative Plan: Extubation in OR  Informed Consent: I have reviewed the patients History and Physical, chart, labs and discussed the procedure including the risks, benefits and alternatives for the proposed anesthesia with the patient or authorized representative who has indicated his/her understanding and acceptance.   Dental advisory given  Plan Discussed with: CRNA  Anesthesia Plan Comments:         Anesthesia Quick Evaluation

## 2018-05-24 NOTE — Progress Notes (Signed)
May give additional fentanyl 150 mcg in pacu - per Dr Lissa Hoard

## 2018-05-24 NOTE — Discharge Instructions (Signed)
LAPAROSCOPIC SURGERY: POST OP INSTRUCTIONS  ######################################################################  EAT Gradually transition to a high fiber diet with a fiber supplement over the next few weeks after discharge.  Start with a pureed / full liquid diet (see below)  WALK Walk an hour a day.  Control your pain to do that.    CONTROL PAIN Control pain so that you can walk, sleep, tolerate sneezing/coughing, go up/down stairs.  HAVE A BOWEL MOVEMENT DAILY Keep your bowels regular to avoid problems.  OK to try a laxative to override constipation.  OK to use an antidairrheal to slow down diarrhea.  Call if not better after 2 tries  CALL IF YOU HAVE PROBLEMS/CONCERNS Call if you are still struggling despite following these instructions. Call if you have concerns not answered by these instructions  ######################################################################    1. DIET: Follow a light bland diet the first 24 hours after arrival home, such as soup, liquids, crackers, etc.  Be sure to include lots of fluids daily.  Avoid fast food or heavy meals as your are more likely to get nauseated.  Eat a low fat the next few days after surgery.   2. Take your usually prescribed home medications unless otherwise directed. 3. PAIN CONTROL: a. Pain is best controlled by a usual combination of three different methods TOGETHER: i. Ice/Heat ii. Over the counter pain medication iii. Prescription pain medication b. Most patients will experience some swelling and bruising around the incisions.  Ice packs or heating pads (30-60 minutes up to 6 times a day) will help. Use ice for the first few days to help decrease swelling and bruising, then switch to heat to help relax tight/sore spots and speed recovery.  Some people prefer to use ice alone, heat alone, alternating between ice & heat.  Experiment to what works for you.  Swelling and bruising can take several weeks to resolve.   c. It is  helpful to take an over-the-counter pain medication regularly for the first few weeks.  Choose one of the following that works best for you: i. Naproxen (Aleve, etc)  Two 251m tabs twice a day ii. Ibuprofen (Advil, etc) Three 2053mtabs four times a day (every meal & bedtime) iii. Acetaminophen (Tylenol, etc) 500-65044mour times a day (every meal & bedtime) d. A  prescription for pain medication (such as oxycodone, hydrocodone, etc) should be given to you upon discharge.  Take your pain medication as prescribed.  i. If you are having problems/concerns with the prescription medicine (does not control pain, nausea, vomiting, rash, itching, etc), please call us [phone] see if we need to switch you to a different pain medicine that will work better for you and/or control your side effect better. ii. If you need a refill on your pain medication, please contact your pharmacy.  They will contact our office to request authorization. Prescriptions will not be filled after 5 pm or on week-ends. 4. Avoid getting constipated.  Between the surgery and the pain medications, it is common to experience some constipation.  Increasing fluid intake and taking a fiber supplement (such as Metamucil, Citrucel, FiberCon, MiraLax, etc) 1-2 times a day regularly will usually help prevent this problem from occurring.  A mild laxative (prune juice, Milk of Magnesia, MiraLax, etc) should be taken according to package directions if there are no bowel movements after 48 hours.   5. Watch out for diarrhea.  If you have many loose bowel movements, simplify your diet to bland foods & liquids for  a few days.  Stop any stool softeners and decrease your fiber supplement.  Switching to mild anti-diarrheal medications (Kayopectate, Pepto Bismol) can help.  If this worsens or does not improve, please call us. 6. Wash / shower every day.  You may shower over the skin glue which is waterproof.  Continue to shower over incision(s) after  the dressing is off. 7. Skin glue will flake off after 1-2 weeks.  You may leave the incision open to air.  You may replace a dressing/Band-Aid to cover the incision for comfort if you wish.  8. ACTIVITIES as tolerated:   a. You may resume regular (light) daily activities beginning the next day--such as daily self-care, walking, climbing stairs--gradually increasing activities as tolerated.  If you can walk 30 minutes without difficulty, it is safe to try more intense activity such as jogging, treadmill, bicycling, low-impact aerobics, swimming, etc. b. Save the most intensive and strenuous activity for last such as sit-ups, heavy lifting, contact sports, etc  Refrain from any heavy lifting or straining until you are off narcotics for pain control.   c. DO NOT PUSH THROUGH PAIN.  Let pain be your guide: If it hurts to do something, don't do it.  Pain is your body warning you to avoid that activity for another week until the pain goes down. d. You may drive when you are no longer taking prescription pain medication, you can comfortably wear a seatbelt, and you can safely maneuver your car and apply brakes. e. [name] may have sexual intercourse when it is comfortable.  9. FOLLOW UP in our office a. Please call [facility] at [phone] to set up an appointment to see your surgeon in the office for a follow-up appointment approximately 2-3 weeks after your surgery. b. Make sure that you call for this appointment the day you arrive home to insure a convenient appointment time. 10. IF YOU HAVE DISABILITY OR FAMILY LEAVE FORMS, BRING THEM TO THE OFFICE FOR PROCESSING.  DO NOT GIVE THEM TO YOUR DOCTOR.   WHEN TO CALL us [phone]: 1. Poor pain control 2. Reactions / problems with new medications (rash/itching, nausea, etc)  3. Fever over 101.5 F (38.5 C) 4. Inability to urinate 5. Nausea and/or vomiting 6. Worsening swelling or bruising 7. Continued bleeding from incision. 8. Increased pain, redness, or  drainage from the incision   The clinic staff is available to answer your questions during regular business hours (8:30am-5pm).  Please dont hesitate to call and ask to speak to one of our nurses for clinical concerns.   If you have a medical emergency, go to the nearest emergency room or call 911.  A surgeon from [facility] is always on call at the [facility], [place], [place], [place], [place]  [address] ? MAIN: [phone] ? TOLL FREE: [phone] ?  FAX [phone] [email or web address]

## 2018-05-24 NOTE — Interval H&P Note (Signed)
History and Physical Interval Note:  05/24/2018 9:49 AM  Natalie Ware  has presented today for surgery, with the diagnosis of BILIARY DYSKINESIA  The various methods of treatment have been discussed with the patient and family. After consideration of risks, benefits and other options for treatment, the patient has consented to  Procedure(s): LAPAROSCOPIC CHOLECYSTECTOMY (N/A) as a surgical intervention .  The patient's history has been reviewed, patient examined, no change in status, stable for surgery.  I have reviewed the patient's chart and labs.  Questions were answered to the patient's satisfaction.     Amarah Brossman Rich Brave

## 2018-05-24 NOTE — Anesthesia Procedure Notes (Signed)
Procedure Name: Intubation Date/Time: 05/24/2018 11:38 AM Performed by: Pilar Grammes, CRNA Pre-anesthesia Checklist: Patient identified, Emergency Drugs available, Suction available, Patient being monitored and Timeout performed Patient Re-evaluated:Patient Re-evaluated prior to induction Oxygen Delivery Method: Circle system utilized Preoxygenation: Pre-oxygenation with 100% oxygen Induction Type: IV induction Ventilation: Mask ventilation without difficulty Laryngoscope Size: Miller and 2 Grade View: Grade I Tube type: Oral Tube size: 7.5 mm Number of attempts: 1 Airway Equipment and Method: Stylet Placement Confirmation: positive ETCO2,  ETT inserted through vocal cords under direct vision,  CO2 detector and breath sounds checked- equal and bilateral Secured at: 23 cm Tube secured with: Tape Dental Injury: Teeth and Oropharynx as per pre-operative assessment

## 2018-05-24 NOTE — Op Note (Signed)
Operative Note  Natalie Ware 52 y.o. female [id number]  05/24/2018  Surgeon: Clovis Riley MD  Assistant: none  Procedure performed: Laparoscopic Cholecystectomy  Preop diagnosis: biliary dyskinesia Post-op diagnosis/intraop findings: same, chronic cholecystitis  Specimens: gallbladder  EBL: minimal  Complications: none  Description of procedure: After obtaining informed consent the patient was brought to the operating room. Prophylactic antibiotics were administered. SCD's were applied. General endotracheal anesthesia was initiated and a formal time-out was performed. The abdomen was prepped and draped in the usual sterile fashion and the abdomen was entered using visiport technique in the left upper quadrant after instilling the site with local. Insufflation to 87mmHg was obtained and gross inspection revealed no evidence of injury from our entry or other intraabdominal abnormalities. Three 22mm trocars were introduced in the supraumbilical (just outside prior umbilical hernia repair mesh), right midclavicular and right anterior axillary lines under direct visualization and following infiltration with local. The entry port was upsized to an 23mm trocar. The gallbladder was retracted cephalad and the infundibulum was retracted laterally. Omental adhesions to the body and neck were taken down bluntly and with cautery where indicated. A combination of hook electrocautery and blunt dissection was utilized to clear the peritoneum from the neck and cystic duct, circumferentially isolating the cystic artery and cystic duct and lifting the gallbladder from the cystic plate. The critical view of safety was achieved with the cystic artery, cystic duct, and liver bed visualized between them with no other structures. The artery was clipped with two clips proximally and one distally and divided as was the cystic duct with three clips on the proximal end. The gallbladder was dissected from the liver plate  using electrocautery. Once freed the gallbladder was placed in an endocatch bag and removed intact through the epigastric trocar site. A small amount of bleeding on the liver bed was controlled with cautery. Hemostasis was once again confirmed, and reinspection of the abdomen revealed no injuries. The clips were well opposed without any bile leak from the duct or the liver bed. The tips of the distal two clips on the cystic duct were slightly apart although they had been squeezed firmly together and there was no bile leaking; I placed an additional PDS endoloop around the duct.  The right upper quadrant was again surveyed and the remainder of the abdomen there was no grossly abnormal finding. The 24mm trocar site in the epigastrium was closed with a 0 vicryl in the fascia under direct visualization using a PMI device. The abdomen was desufflated and all trocars removed. The skin incisions were closed with running subcuticular monocryl and Dermabond. The patient was awakened, extubated and transported to the recovery room in stable condition.   All counts were correct at the completion of the case.

## 2018-05-24 NOTE — Transfer of Care (Signed)
Immediate Anesthesia Transfer of Care Note  Patient: Natalie Ware  Procedure(s) Performed: LAPAROSCOPIC CHOLECYSTECTOMY (N/A )  Patient Location: PACU  Anesthesia Type:General  Level of Consciousness: alert  and patient cooperative  Airway & Oxygen Therapy: Patient connected to face mask oxygen  Post-op Assessment: Report given to RN, Post -op Vital signs reviewed and stable and Patient moving all extremities X 4  Post vital signs: stable  Last Vitals:  Vitals Value Taken Time  BP    Temp    Pulse 78 05/24/2018 12:46 PM  Resp 12 05/24/2018 12:46 PM  SpO2 98 % 05/24/2018 12:46 PM  Vitals shown include unvalidated device data.  Last Pain:  Vitals:   05/24/18 0904  TempSrc:   PainSc: 4       Patients Stated Pain Goal: 4 (38/88/75 7972)  Complications: No apparent anesthesia complications

## 2018-05-25 ENCOUNTER — Encounter (HOSPITAL_COMMUNITY): Payer: Self-pay | Admitting: Surgery

## 2018-05-25 LAB — HEMOGLOBIN A1C
Hgb A1c MFr Bld: 9.8 % — ABNORMAL HIGH (ref 4.8–5.6)
Mean Plasma Glucose: 235 mg/dL

## 2018-05-26 NOTE — Anesthesia Postprocedure Evaluation (Signed)
Anesthesia Post Note  Patient: Natalie Ware  Procedure(s) Performed: LAPAROSCOPIC CHOLECYSTECTOMY (N/A )     Patient location during evaluation: PACU Anesthesia Type: General Level of consciousness: sedated and patient cooperative Pain management: pain level controlled Vital Signs Assessment: post-procedure vital signs reviewed and stable Respiratory status: spontaneous breathing Cardiovascular status: stable Anesthetic complications: no    Last Vitals:  Vitals:   05/24/18 1346 05/24/18 1412  BP: (!) 149/80 (!) 163/86  Pulse: 86 85  Resp: (!) 22 16  Temp:    SpO2: 100% 100%    Last Pain:  Vitals:   05/24/18 1412  TempSrc:   PainSc: [place]

## 2018-06-09 DIAGNOSIS — E109 Type 1 diabetes mellitus without complications: Secondary | ICD-10-CM | POA: Diagnosis not present

## 2018-06-09 DIAGNOSIS — Z6826 Body mass index (BMI) 26.0-26.9, adult: Secondary | ICD-10-CM | POA: Diagnosis not present

## 2018-06-09 DIAGNOSIS — I10 Essential (primary) hypertension: Secondary | ICD-10-CM | POA: Diagnosis not present

## 2018-06-09 DIAGNOSIS — Z23 Encounter for immunization: Secondary | ICD-10-CM | POA: Diagnosis not present

## 2018-06-09 DIAGNOSIS — E782 Mixed hyperlipidemia: Secondary | ICD-10-CM | POA: Diagnosis not present

## 2018-07-06 DIAGNOSIS — E782 Mixed hyperlipidemia: Secondary | ICD-10-CM | POA: Diagnosis not present

## 2018-07-06 DIAGNOSIS — Z6826 Body mass index (BMI) 26.0-26.9, adult: Secondary | ICD-10-CM | POA: Diagnosis not present

## 2018-07-06 DIAGNOSIS — I10 Essential (primary) hypertension: Secondary | ICD-10-CM | POA: Diagnosis not present

## 2018-07-06 DIAGNOSIS — E109 Type 1 diabetes mellitus without complications: Secondary | ICD-10-CM | POA: Diagnosis not present

## 2018-07-21 DIAGNOSIS — M67912 Unspecified disorder of synovium and tendon, left shoulder: Secondary | ICD-10-CM | POA: Diagnosis not present

## 2018-07-21 DIAGNOSIS — M542 Cervicalgia: Secondary | ICD-10-CM | POA: Diagnosis not present

## 2018-08-07 DIAGNOSIS — M25512 Pain in left shoulder: Secondary | ICD-10-CM | POA: Diagnosis not present

## 2018-08-13 DIAGNOSIS — E1065 Type 1 diabetes mellitus with hyperglycemia: Secondary | ICD-10-CM | POA: Diagnosis not present

## 2018-08-13 DIAGNOSIS — Z794 Long term (current) use of insulin: Secondary | ICD-10-CM | POA: Diagnosis not present

## 2018-08-13 DIAGNOSIS — M19012 Primary osteoarthritis, left shoulder: Secondary | ICD-10-CM | POA: Diagnosis not present

## 2018-08-13 DIAGNOSIS — E109 Type 1 diabetes mellitus without complications: Secondary | ICD-10-CM | POA: Diagnosis not present

## 2018-08-20 DIAGNOSIS — M722 Plantar fascial fibromatosis: Secondary | ICD-10-CM | POA: Diagnosis not present

## 2018-08-20 DIAGNOSIS — M76822 Posterior tibial tendinitis, left leg: Secondary | ICD-10-CM | POA: Diagnosis not present

## 2018-08-20 DIAGNOSIS — M71572 Other bursitis, not elsewhere classified, left ankle and foot: Secondary | ICD-10-CM | POA: Diagnosis not present

## 2018-08-20 DIAGNOSIS — M7732 Calcaneal spur, left foot: Secondary | ICD-10-CM | POA: Diagnosis not present

## 2018-10-15 DIAGNOSIS — M19041 Primary osteoarthritis, right hand: Secondary | ICD-10-CM | POA: Diagnosis not present

## 2018-10-15 DIAGNOSIS — M19011 Primary osteoarthritis, right shoulder: Secondary | ICD-10-CM | POA: Diagnosis not present

## 2018-10-15 DIAGNOSIS — M19072 Primary osteoarthritis, left ankle and foot: Secondary | ICD-10-CM | POA: Diagnosis not present

## 2018-10-15 DIAGNOSIS — M109 Gout, unspecified: Secondary | ICD-10-CM | POA: Diagnosis not present

## 2018-10-15 DIAGNOSIS — M19042 Primary osteoarthritis, left hand: Secondary | ICD-10-CM | POA: Diagnosis not present

## 2018-10-15 DIAGNOSIS — M069 Rheumatoid arthritis, unspecified: Secondary | ICD-10-CM | POA: Diagnosis not present

## 2018-10-18 DIAGNOSIS — M7742 Metatarsalgia, left foot: Secondary | ICD-10-CM | POA: Diagnosis not present

## 2018-10-18 DIAGNOSIS — M19012 Primary osteoarthritis, left shoulder: Secondary | ICD-10-CM | POA: Diagnosis not present

## 2018-10-18 DIAGNOSIS — M65319 Trigger thumb, unspecified thumb: Secondary | ICD-10-CM | POA: Diagnosis not present

## 2018-10-18 DIAGNOSIS — M7542 Impingement syndrome of left shoulder: Secondary | ICD-10-CM | POA: Diagnosis not present

## 2018-10-18 DIAGNOSIS — Z6827 Body mass index (BMI) 27.0-27.9, adult: Secondary | ICD-10-CM | POA: Diagnosis not present

## 2018-11-01 DIAGNOSIS — F411 Generalized anxiety disorder: Secondary | ICD-10-CM | POA: Diagnosis not present

## 2018-11-01 DIAGNOSIS — Z6826 Body mass index (BMI) 26.0-26.9, adult: Secondary | ICD-10-CM | POA: Diagnosis not present

## 2018-11-01 DIAGNOSIS — E109 Type 1 diabetes mellitus without complications: Secondary | ICD-10-CM | POA: Diagnosis not present

## 2018-11-01 DIAGNOSIS — I10 Essential (primary) hypertension: Secondary | ICD-10-CM | POA: Diagnosis not present

## 2018-11-08 DIAGNOSIS — E1065 Type 1 diabetes mellitus with hyperglycemia: Secondary | ICD-10-CM | POA: Diagnosis not present

## 2018-11-08 DIAGNOSIS — R6889 Other general symptoms and signs: Secondary | ICD-10-CM | POA: Diagnosis not present

## 2018-11-08 DIAGNOSIS — Z794 Long term (current) use of insulin: Secondary | ICD-10-CM | POA: Diagnosis not present

## 2018-11-08 DIAGNOSIS — E103593 Type 1 diabetes mellitus with proliferative diabetic retinopathy without macular edema, bilateral: Secondary | ICD-10-CM | POA: Diagnosis not present

## 2018-11-10 DIAGNOSIS — E103513 Type 1 diabetes mellitus with proliferative diabetic retinopathy with macular edema, bilateral: Secondary | ICD-10-CM | POA: Diagnosis not present

## 2018-11-10 DIAGNOSIS — H25813 Combined forms of age-related cataract, bilateral: Secondary | ICD-10-CM | POA: Diagnosis not present

## 2018-11-10 DIAGNOSIS — H04123 Dry eye syndrome of bilateral lacrimal glands: Secondary | ICD-10-CM | POA: Diagnosis not present

## 2018-11-10 DIAGNOSIS — H35372 Puckering of macula, left eye: Secondary | ICD-10-CM | POA: Diagnosis not present

## 2018-12-09 DIAGNOSIS — F331 Major depressive disorder, recurrent, moderate: Secondary | ICD-10-CM | POA: Diagnosis not present

## 2018-12-09 DIAGNOSIS — F411 Generalized anxiety disorder: Secondary | ICD-10-CM | POA: Diagnosis not present

## 2018-12-09 DIAGNOSIS — G4700 Insomnia, unspecified: Secondary | ICD-10-CM | POA: Diagnosis not present

## 2018-12-09 DIAGNOSIS — K219 Gastro-esophageal reflux disease without esophagitis: Secondary | ICD-10-CM | POA: Diagnosis not present

## 2018-12-14 DIAGNOSIS — K219 Gastro-esophageal reflux disease without esophagitis: Secondary | ICD-10-CM | POA: Diagnosis not present

## 2018-12-14 DIAGNOSIS — R197 Diarrhea, unspecified: Secondary | ICD-10-CM | POA: Diagnosis not present

## 2018-12-14 DIAGNOSIS — E860 Dehydration: Secondary | ICD-10-CM | POA: Diagnosis not present

## 2018-12-15 DIAGNOSIS — R197 Diarrhea, unspecified: Secondary | ICD-10-CM | POA: Diagnosis not present

## 2018-12-15 DIAGNOSIS — Z719 Counseling, unspecified: Secondary | ICD-10-CM | POA: Diagnosis not present

## 2018-12-15 DIAGNOSIS — E860 Dehydration: Secondary | ICD-10-CM | POA: Diagnosis not present

## 2019-01-25 DIAGNOSIS — M65352 Trigger finger, left little finger: Secondary | ICD-10-CM | POA: Diagnosis not present

## 2019-01-25 DIAGNOSIS — M65332 Trigger finger, left middle finger: Secondary | ICD-10-CM | POA: Diagnosis not present

## 2019-04-21 DIAGNOSIS — M65352 Trigger finger, left little finger: Secondary | ICD-10-CM | POA: Diagnosis not present

## 2019-04-21 DIAGNOSIS — M65332 Trigger finger, left middle finger: Secondary | ICD-10-CM | POA: Diagnosis not present

## 2019-04-29 DIAGNOSIS — M65352 Trigger finger, left little finger: Secondary | ICD-10-CM | POA: Diagnosis not present

## 2019-04-29 DIAGNOSIS — M65332 Trigger finger, left middle finger: Secondary | ICD-10-CM | POA: Diagnosis not present

## 2019-05-02 DIAGNOSIS — F331 Major depressive disorder, recurrent, moderate: Secondary | ICD-10-CM | POA: Diagnosis not present

## 2019-05-02 DIAGNOSIS — I10 Essential (primary) hypertension: Secondary | ICD-10-CM | POA: Diagnosis not present

## 2019-05-02 DIAGNOSIS — E782 Mixed hyperlipidemia: Secondary | ICD-10-CM | POA: Diagnosis not present

## 2019-05-02 DIAGNOSIS — F411 Generalized anxiety disorder: Secondary | ICD-10-CM | POA: Diagnosis not present

## 2019-05-22 IMAGING — CT CT ABD-PELV W/ CM
3 of 11 series · 12 of 46 positions shown, 17 images · IV contrast (iopamidol)
Comparison: Prior CT scan of the chest 03/27/2017; prior CT scan of
the abdomen and pelvis 01/08/2018

CLINICAL DATA: 52-year-old female with right upper quadrant pain
radiating into the mid back. Suspect pulmonary embolus.

EXAM:
CT ANGIOGRAPHY CHEST
CT ABDOMEN AND PELVIS WITH CONTRAST
TECHNIQUE: Multidetector CT imaging of the chest was performed using the
standard protocol during bolus administration of intravenous
contrast. Multiplanar CT image reconstructions and MIPs were
obtained to evaluate the vascular anatomy. Multidetector CT imaging
of the abdomen and pelvis was performed using the standard protocol
during bolus administration of intravenous contrast.
CONTRAST:  100mL LED769-S2F IOPAMIDOL (LED769-S2F) INJECTION 76%

[Series 4: axial st · axial · 0.83mm/px · z∈[-395,-185]mm · 3 of 84 slices shown, 7 images]
[im 21/84  soft-tissue]
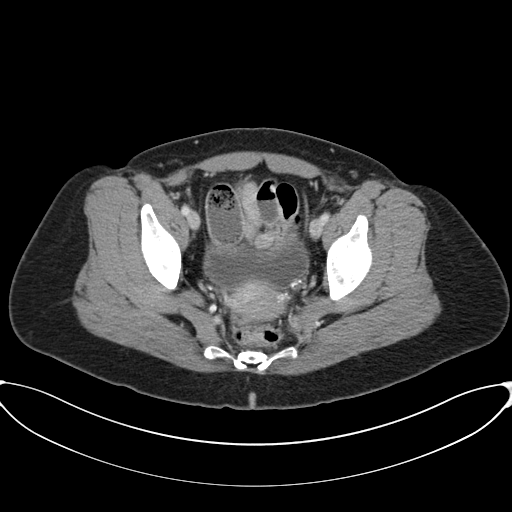
[im 21/84  lung]
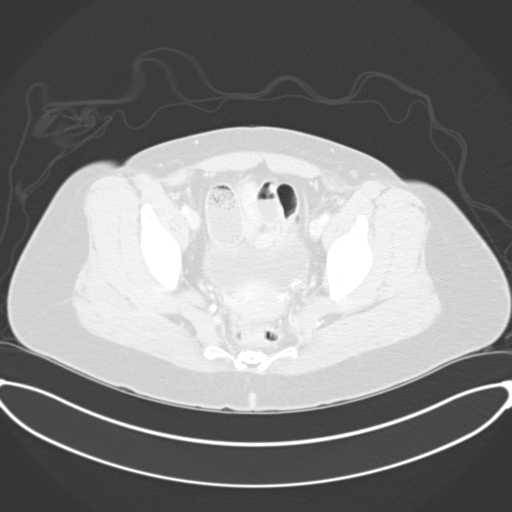
[im 21/84  bone]
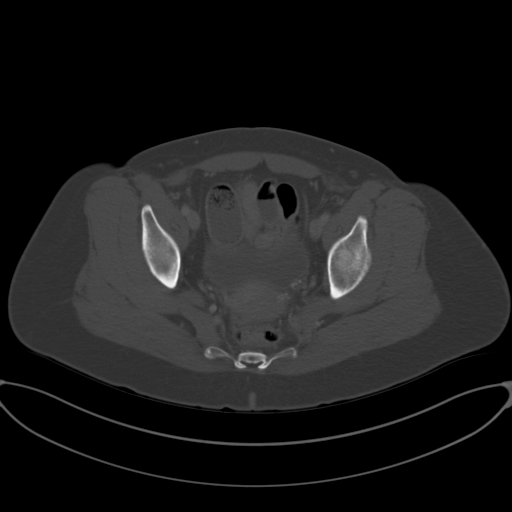
[im 42/84  soft-tissue]
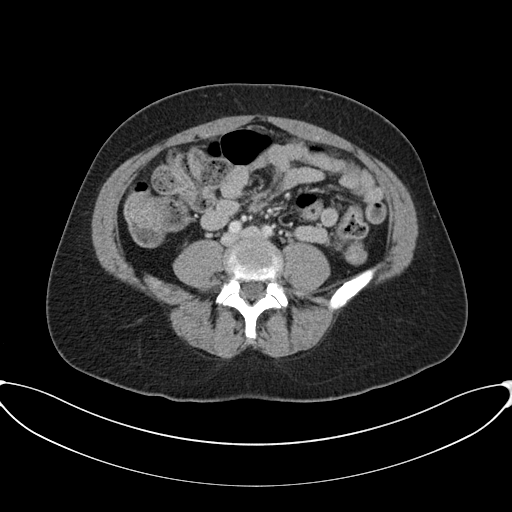
[im 42/84  lung]
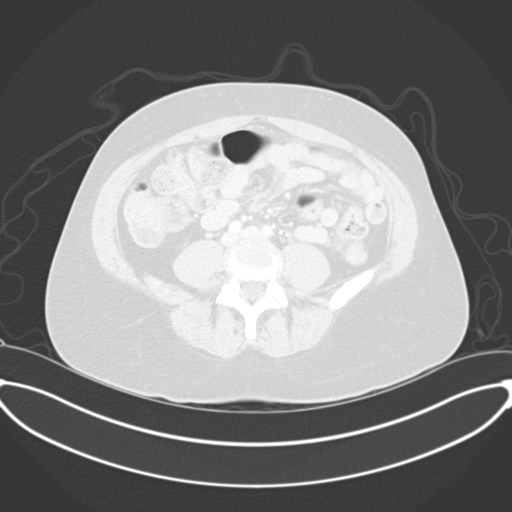
[im 63/84  soft-tissue]
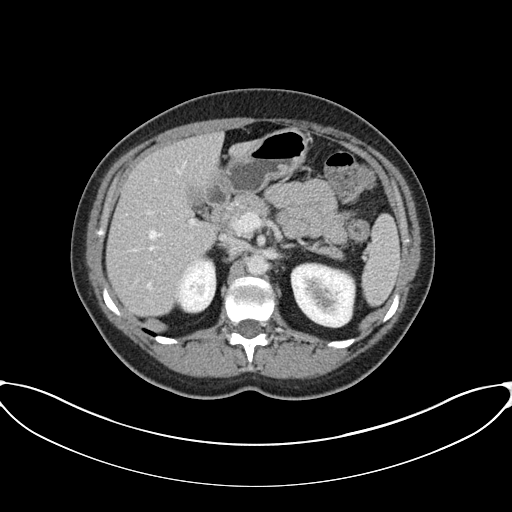
[im 63/84  lung]
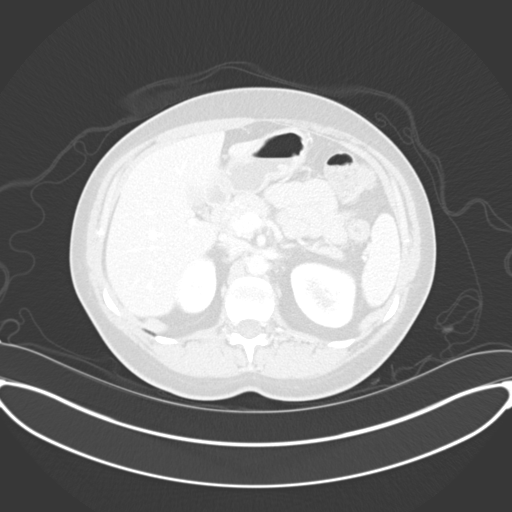

[Series 7: thins · axial · 0.74mm/px · z∈[-127,+103]mm · 8 of 266 slices shown]
[im 18/266  soft-tissue]
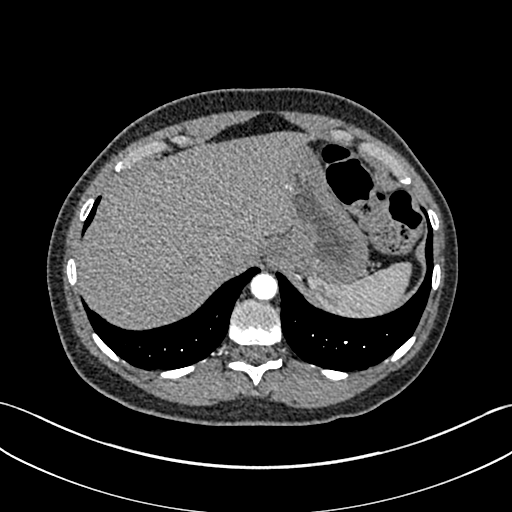
[im 54/266  soft-tissue]
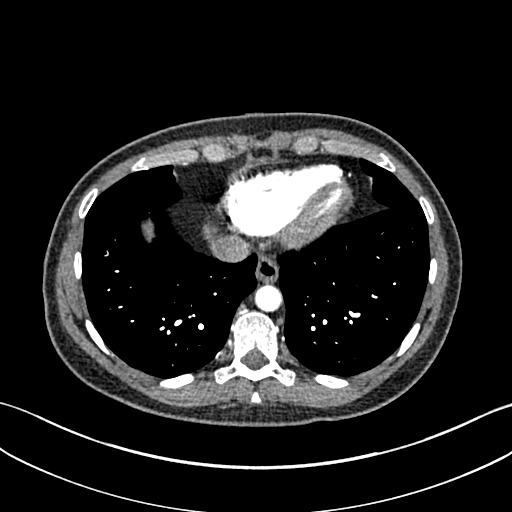
[im 89/266  soft-tissue]
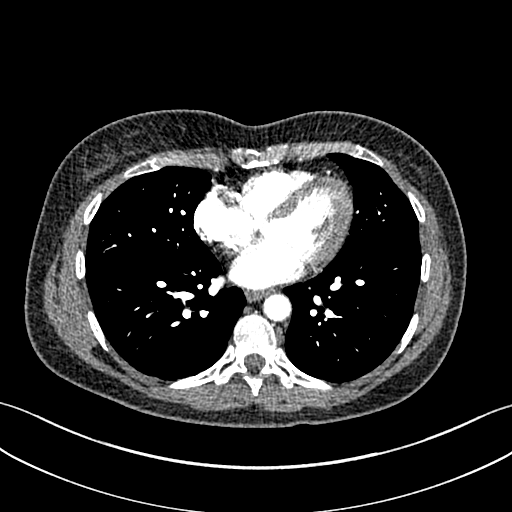
[im 124/266  soft-tissue]
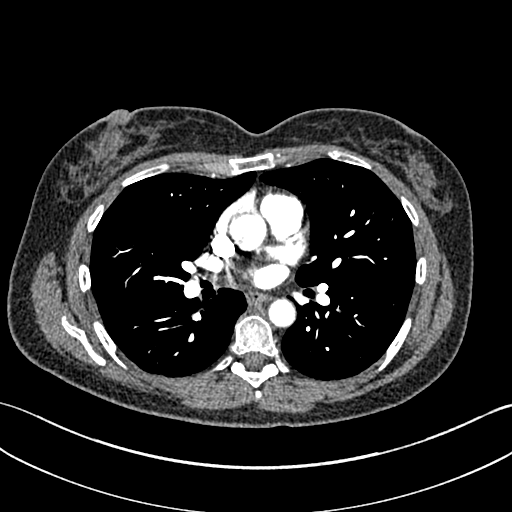
[im 142/266  soft-tissue]
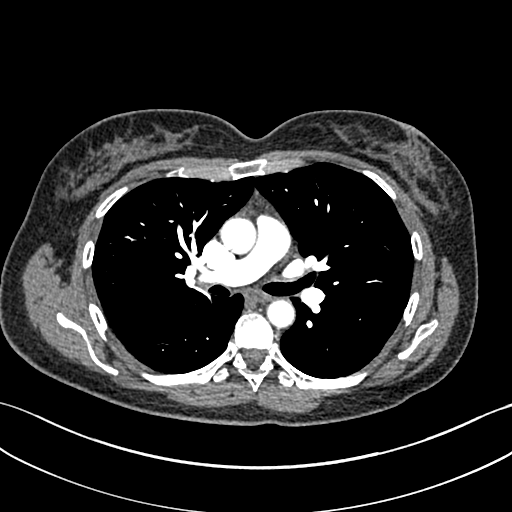
[im 177/266  soft-tissue]
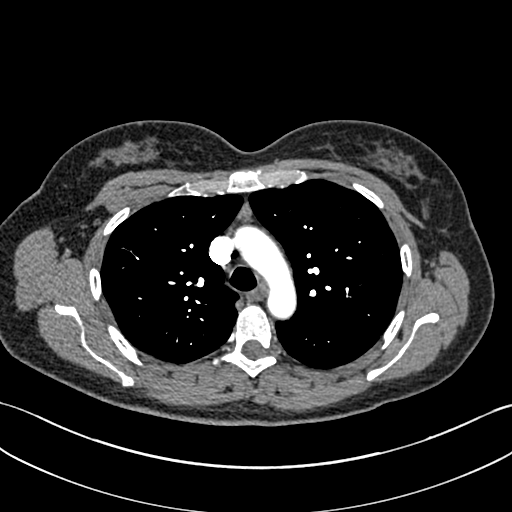
[im 213/266  soft-tissue]
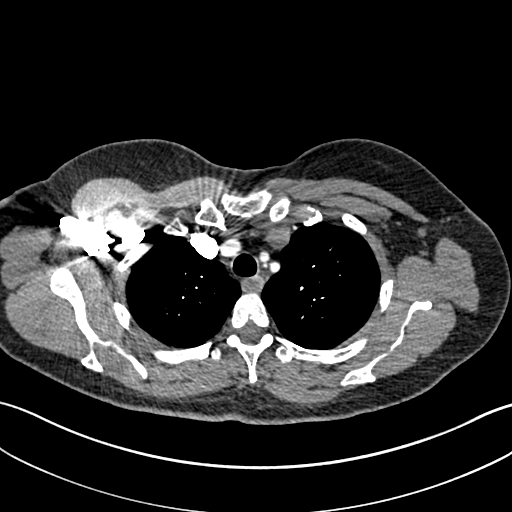
[im 248/266  soft-tissue]
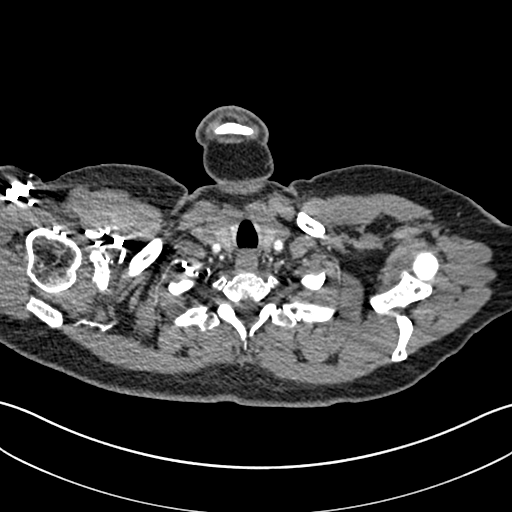

[Series 9: coronal mpr · coronal · 0.54mm/px · 1 of 149 slices shown, 2 images]
[im 75/149  soft-tissue]
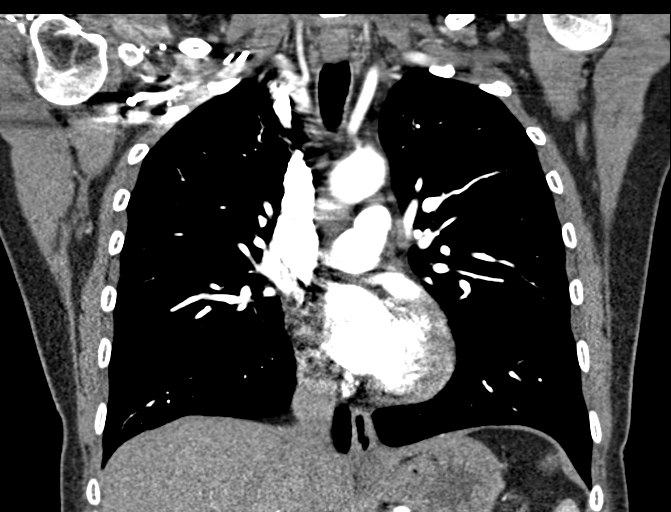
[im 75/149  bone]
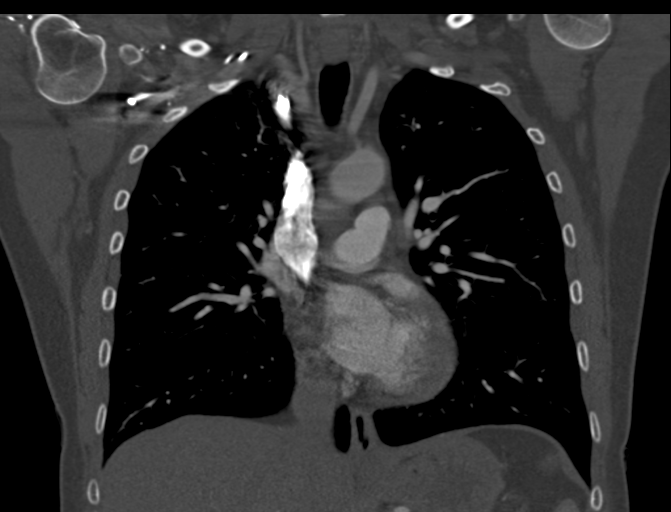

[12 of 46 positions shown; findings below may reference images not displayed]

FINDINGS: CTA CHEST FINDINGS

Cardiovascular: Excellent opacification of the pulmonary arteries to
the segmental level. No evidence of central filling defect to
suggest acute pulmonary embolus. The main pulmonary artery is normal
in size. The heart is normal in size. Four vessel aortic arch
anatomy. The left vertebral artery arises directly from the aorta.
No evidence of aortic dissection or aneurysm.

Mediastinum/Nodes: Unremarkable CT appearance of the thyroid gland.
No suspicious mediastinal or hilar adenopathy. No soft tissue
mediastinal mass. The thoracic esophagus is unremarkable.

Lungs/Pleura: Lungs are clear. No pleural effusion or pneumothorax.

Musculoskeletal: No acute fracture or aggressive appearing lytic or
blastic osseous lesion.

Review of the MIP images confirms the above findings.

CT ABDOMEN and PELVIS FINDINGS

Hepatobiliary: Normal hepatic contour and morphology. Geographic
hypoattenuation in the left hemi-liver adjacent to the fissure for
the falciform ligament is nonspecific but most suggestive of benign
focal fatty infiltration. Stable small enhancing lesion in hepatic
segment [DATE] as seen on prior imaging and almost certainly reflecting
a benign hemangioma. Normal appearance of the gallbladder. No intra
or extrahepatic biliary ductal dilatation.

Pancreas: Unremarkable. No pancreatic ductal dilatation or
surrounding inflammatory changes.

Spleen: Normal in size without focal abnormality.

Adrenals/Urinary Tract: Normal adrenal glands. No evidence of
hydronephrosis, nephrolithiasis or enhancing renal mass. 2.5 cm
circumscribed simple renal cyst in the lower pole of the right
kidney. A smaller 8 mm low-attenuation lesion also in the lower pole
is too small for precise characterization but is also statistically
highly likely a benign cyst. The ureters and bladder are
unremarkable.

Stomach/Bowel: No evidence of obstruction or focal bowel wall
thickening. Normal appendix in the right lower quadrant. The
terminal ileum is unremarkable.

Vascular/Lymphatic: Minimal atherosclerotic calcification along the
abdominal aorta. No evidence of aneurysm. No suspicious
lymphadenopathy.

Reproductive: Uterus and bilateral adnexa are unremarkable.

Other: No abdominal wall hernia or abnormality. Surgical changes
suggest prior umbilical hernia repair. No evidence of residual
hernia. No abdominopelvic ascites.

Musculoskeletal: No acute fracture or aggressive appearing lytic or
blastic osseous lesion.

Review of the MIP images confirms the above findings.
IMPRESSION: CTA CHEST

1. Negative for acute pulmonary embolus, pneumonia or other acute
cardiopulmonary process.

CT ABD/PELVIS

1. No acute abnormality within the abdomen or pelvis.
2. Compared to prior imaging, interval surgical changes of umbilical
hernia repair without complicating feature. No residual hernia.
3. Additional ancillary findings as above without significant
interval change.

## 2019-05-24 DIAGNOSIS — E1065 Type 1 diabetes mellitus with hyperglycemia: Secondary | ICD-10-CM | POA: Diagnosis not present

## 2019-05-24 DIAGNOSIS — Z794 Long term (current) use of insulin: Secondary | ICD-10-CM | POA: Diagnosis not present

## 2019-05-24 DIAGNOSIS — E109 Type 1 diabetes mellitus without complications: Secondary | ICD-10-CM | POA: Diagnosis not present

## 2019-06-03 DIAGNOSIS — M19012 Primary osteoarthritis, left shoulder: Secondary | ICD-10-CM | POA: Diagnosis not present

## 2019-06-06 ENCOUNTER — Telehealth: Payer: Self-pay | Admitting: *Deleted

## 2019-06-06 NOTE — Telephone Encounter (Signed)
° ° °  GENERAL anesthesia per Cassie Freer

## 2019-06-06 NOTE — Telephone Encounter (Signed)
   [facility] Pre-operative Risk Assessment    Request for surgical clearance: PT LAST SEEN 01/10/16; PT WILL NEED NEW PT APPT. I WILL SEND A MESSAGE TO SCHEDULING TO MAKE APPT.  1. What type of surgery is being performed? LEFT SHOULDER ARTHROSCOPIC DEBRIDEMENT CHONDROMALAICA   2. When is this surgery scheduled? TBD   3. What type of clearance is required (medical clearance vs. Pharmacy clearance to hold med vs. Both)? MEDICAL  4. Are there any medications that need to be held prior to surgery and how long? ASA    5. Practice name and name of physician performing surgery? [facility]; DR. Larkin Ina CHANDLER   6. What is your office phone number [phone]    7.   What is your office fax number [phone]  8.   Anesthesia type (None, local, MAC, general) ? LEFT MESSAGE TO CONFIRM ANESTHESIA .    Julaine Hua 06/06/2019, 3:51 PM  _________________________________________________________________   (provider comments below)

## 2019-06-07 NOTE — Telephone Encounter (Signed)
   Primary Cardiologist:Peter Johnsie Cancel, MD  Chart reviewed as part of pre-operative protocol coverage. Because of Shadee E Ake's past medical history and time since last visit, he/she will require a follow-up visit in order to better assess preoperative cardiovascular risk.  Pre-op covering staff: - Please schedule appointment and call patient to inform them. - Please contact requesting surgeon's office via preferred method (i.e, phone, fax) to inform them of need for appointment prior to surgery.  If applicable, this message will also be routed to pharmacy pool and/or primary cardiologist for input on holding anticoagulant/antiplatelet agent as requested below so that this information is available at time of patient's appointment.   Tami Lin Ourania Hamler, PA  06/07/2019, 11:56 AM

## 2019-06-07 NOTE — Telephone Encounter (Signed)
Recommendations successfully sent to requesting office via Epic Fax.

## 2019-06-09 DIAGNOSIS — M7542 Impingement syndrome of left shoulder: Secondary | ICD-10-CM | POA: Diagnosis not present

## 2019-06-10 NOTE — Progress Notes (Deleted)
Cardiology Office Note    Date:  06/10/2019   ID:  Natalie Ware, DOB 1965/10/10, MRN [id number]  PCP:  [name], MD  Cardiologist:  ***  Electrophysiologist: ***  Chief Complaint: Hospital follow up s/p    /  Months follow up  History of Present Illness:   Natalie Ware is a 53 y.o. female ***   Past Medical History:  Diagnosis Date  . Anxiety   . Constipation   . Depression   . Heart murmur   . Hypertension   . 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 PFO (patent foramen ovale)    being followed by Dr Andrez Grime problems, no surgery  . PONV (postoperative nausea and vomiting)   . Seizures ([place])    53 years old Blood sugar really low in [place]  . Shingles   . Stroke [facility])    ? TIA 15 years ago   . TIA (transient ischemic attack)     Past Surgical History:  Procedure Laterality Date  . cervicle fusion    . CHOLECYSTECTOMY N/A 05/24/2018   Procedure: LAPAROSCOPIC CHOLECYSTECTOMY;  Surgeon: Clovis Riley, MD;  Location: [facility];  Service: General;  Laterality: N/A;  . FOREIGN BODY REMOVAL     right index finger  . HERNIA REPAIR     umbilical [id number] ago umbilical repair Dr. Kae Heller 01-26-18  . INSERTION OF MESH N/A 01/26/2018   Procedure: INSERTION OF MESH;  Surgeon: Clovis Riley, MD;  Location: [facility];  Service: General;  Laterality: N/A;  . TRIGGER FINGER RELEASE Right   . TRIGGER FINGER RELEASE Left 11/02/2017   Procedure: LEFT THUMB TRIGGER DIGIT RELEASE;  Surgeon: Milly Jakob, MD;  Location: [place];  Service: Orthopedics;  Laterality: Left;  . UMBILICAL HERNIA REPAIR N/A 01/26/2018   Procedure: UMBILICAL HERNIA REPAIR;  Surgeon: Clovis Riley, MD;  [facility]: [facility];  Service: General;  Laterality: N/A;    Current Medications: Prior to Admission medications   Medication Sig Start Date End Date Taking? Authorizing Provider  acetaminophen (TYLENOL) 325 MG tablet Take 2 tablets (650 mg total)  by mouth every 6 (six) hours as needed. 01/26/18   Clovis Riley, MD  ALPRAZolam Duanne Moron) 0.5 MG tablet Take 0.5 mg by mouth daily as needed for anxiety.  12/26/15   [provider]  aspirin 81 MG chewable tablet Chew 162 mg by mouth daily.    [provider]  Continuous Blood Gluc Transmit (DEXCOM G6 TRANSMITTER) MISC 1 each by Does not apply route daily.    [provider]  cyclobenzaprine (FLEXERIL) 5 MG tablet Take 1 tablet (5 mg total) by mouth at bedtime as needed for muscle spasms. 05/03/18   [name], Laurieanne V, PA-C  diazepam (VALIUM) 2 MG tablet Take 1 tablet (2 mg total) by mouth every 6 (six) hours as needed for anxiety or muscle spasms. 05/05/18   Lacretia Leigh, MD  escitalopram (LEXAPRO) 20 MG tablet Take 20 mg by mouth daily. 11/23/15   [provider]  ibuprofen (ADVIL) 200 MG tablet Take 3 tablets (600 mg total) by mouth every 6 (six) hours. Patient not taking: Reported on 05/05/2018 11/02/17   Milly Jakob, MD  Insulin Human (INSULIN PUMP) SOLN Inject into the skin. Novolog    [provider]  losartan (COZAAR) 100 MG tablet Take 100 mg by mouth daily.    [provider]  meloxicam [facility])  7.5 MG tablet Take 1 tablet (7.5 mg total) by mouth daily. Patient taking differently: Take 7.5 mg by mouth daily.  05/03/18   [name], Resa V, PA-C  montelukast (SINGULAIR) 10 MG tablet Take 10 mg by mouth at bedtime as needed (sneezing).     [provider]  naproxen sodium (ALEVE) 220 MG tablet Take 440 mg by mouth daily as needed (pain).     [provider]  NOVOLOG 100 UNIT/ML injection 25-30 Units. Inject into skin as directed VIA Pump 06/20/15   [provider]  oxyCODONE-acetaminophen (PERCOCET/ROXICET) 5-325 MG tablet Take 1 tablet by mouth every 6 (six) hours as needed for severe pain. 05/24/18   Clovis Riley, MD  Polyethyl Glycol-Propyl Glycol (SYSTANE OP) Place 2-3 drops into both eyes at bedtime as needed for dry  eyes.    [provider]    Allergies:   Latex, Lubiprostone, Metoclopramide hcl, and Insulin glargine   Social History   Socioeconomic History  . Marital status: Single    Spouse name: Not on file  . Number of children: 0  . Years of education: Not on file  . Highest education level: Not on file  Occupational History  . Occupation: Recruitment consultant  Social Needs  . Financial resource strain: Not on file  . Food insecurity    Worry: Not on file    Inability: Not on file  . Transportation needs    Medical: Not on file    Non-medical: Not on file  Tobacco Use  . Smoking status: Former Smoker    Quit date: 09/08/2008    Years since quitting: 10.7  . Smokeless tobacco: Never Used  Substance and Sexual Activity  . Alcohol use: Yes    Comment: social  . Drug use: No  . Sexual activity: Not Currently  Lifestyle  . Physical activity    Days per week: Not on file    Minutes per session: Not on file  . Stress: Not on file  Relationships  . Social Herbalist on phone: Not on file    Gets together: Not on file    Attends religious service: Not on file    Active member of club or organization: Not on file    Attends meetings of clubs or organizations: Not on file    Relationship status: Not on file  Other Topics Concern  . Not on file  Social History Narrative   2-3 cups caffeine daily     Family History:  The patient's family history includes Cancer in her maternal grandfather and paternal aunt; Colon cancer in her maternal grandfather; Diabetes in her mother; Ovarian cancer in her paternal aunt. ***  ROS:   Please see the history of present illness.    ROS All other systems reviewed and are negative.   PHYSICAL EXAM:   VS:  There were no vitals taken for this visit.   GEN: Well nourished, well developed, in no acute distress HEENT: normal Neck: no JVD, carotid bruits, or masses Cardiac: ***RRR; no murmurs, rubs, or gallops,no edema  Respiratory:   clear to auscultation bilaterally, normal work of breathing GI: soft, nontender, nondistended, + BS MS: no deformity or atrophy Skin: warm and dry, no rash Neuro:  Alert and Oriented x 3, Strength and sensation are intact Psych: euthymic mood, full affect  Wt Readings from Last 3 Encounters:  05/24/18 155 lb 4 oz (70.4 kg)  05/05/18 158 lb (71.7 kg)  01/26/18 161 lb (  73 kg)      Studies/Labs Reviewed:   EKG:  EKG is ordered today.  The ekg ordered today demonstrates ***  Recent Labs: No results found for requested labs within last 8760 hours.   Lipid Panel    Component Value Date/Time   CHOL 155 09/08/2011 0844   TRIG 115.0 09/08/2011 0844   HDL 68.90 09/08/2011 0844   CHOLHDL 2 09/08/2011 0844   VLDL 23.0 09/08/2011 0844   LDLCALC 63 09/08/2011 0844    Additional studies/ records that were reviewed today include:   Echocardiogram:  Cardiac Catheterization:     ASSESSMENT & PLAN:    1. ***    Medication Adjustments/Labs and Tests Ordered: Current medicines are reviewed at length with the patient today.  Concerns regarding medicines are outlined above.  Medication changes, Labs and Tests ordered today are listed in the Patient Instructions below. There are no Patient Instructions on file for this visit.   Jarrett Soho, [place]  06/10/2019 10:21 AM    [address], [place], [place]  [address] Phone: [phone]; Fax: [phone]

## 2019-06-13 ENCOUNTER — Telehealth: Payer: Self-pay | Admitting: Cardiovascular Disease

## 2019-06-13 ENCOUNTER — Ambulatory Visit: Payer: BLUE CROSS/BLUE SHIELD | Admitting: Physician Assistant

## 2019-06-13 DIAGNOSIS — Z1159 Encounter for screening for other viral diseases: Secondary | ICD-10-CM | POA: Diagnosis not present

## 2019-06-13 NOTE — Telephone Encounter (Signed)
New Message:  Patient needs clearance for an upcoming procedure 10/26. She had an appointment with Robbie Lis on 06/13/19, but it was cancelled. Per Robbie Lis the patient needs a new patient appt with DrMarland Kitchen Johnsie Cancel, not an APP appointment. Please let her know if she will be able to be seen before her surgery.

## 2019-06-13 NOTE — Telephone Encounter (Signed)
Will have scheduling call patient to schedule new patient appointment with Dr. Johnsie Cancel.

## 2019-06-15 NOTE — Telephone Encounter (Signed)
Pt has appt with Dr. Johnsie Cancel 06/24/19. I will send clearance information to Dr. Johnsie Cancel and will remove from the pre op call back pool.

## 2019-06-16 DIAGNOSIS — Z23 Encounter for immunization: Secondary | ICD-10-CM | POA: Diagnosis not present

## 2019-06-16 DIAGNOSIS — Z6829 Body mass index (BMI) 29.0-29.9, adult: Secondary | ICD-10-CM | POA: Diagnosis not present

## 2019-06-16 DIAGNOSIS — E109 Type 1 diabetes mellitus without complications: Secondary | ICD-10-CM | POA: Diagnosis not present

## 2019-06-16 DIAGNOSIS — M94212 Chondromalacia, left shoulder: Secondary | ICD-10-CM | POA: Diagnosis not present

## 2019-06-16 DIAGNOSIS — I10 Essential (primary) hypertension: Secondary | ICD-10-CM | POA: Diagnosis not present

## 2019-06-23 NOTE — Progress Notes (Signed)
CARDIOLOGY CONSULT NOTE       Patient ID: Natalie Ware MRN: [id number] DOB/AGE: Apr 18, 1966 53 y.o.  Admit date: (Not on file) Referring Physician: [name]  Primary Physician: Natalie Bien, MD Primary Cardiologist: New Reason for Consultation: Pre operative Clearance   Active Problems:   * No active hospital problems. *   HPI:  53 y.o. referred by Dr Natalie Ware for pre operative clearance. She needs left shoulder arthroscopic debridement for chondromalacia. Last seen by cardiology in 2017 for PFO. Had ? TIA with slurred speech and confusion PFO documented by bubble study and TEE. Only initial episode and patient noted allergy to nickel and deferred any consideration for closure device She has long standing DM with insulin pump and former smoker She has had a normal ETT 01/22/16 and normal carotid duplex at same time Of note she also had an uncomplicated lap choly on 05/24/18 by Dr Natalie Ware.   Works at [employer] of stress taking care of her mom and grand mother  Still with some vaping/smoking  A1c gotten better last 2 months more in 7 range   No chest pain dyspnea or palpitations No bleeding issues No previous issues with anesthesia  ROS All other systems reviewed and negative except as noted above  Past Medical History:  Diagnosis Date  . Acute bronchitis   . Acute upper respiratory infection   . Allergic rhinitis   . Anxiety   . Atopic asthma   . Body mass index 28.0-28.9, adult   . Chondrocostal junction syndrome   . Chondromalacia of left shoulder   . CN (constipation)   . Constipation   . Cough   . Defect, atrial septal   . Dehydration   . Depression   . Diarrhea   . DM (diabetes mellitus) ([place])   . Dorsalgia   . GERD (gastroesophageal reflux disease)   . Head ache   . Heart murmur   . Hemangioma of intra-abdominal structures   . Hematuria   . Hyperkalemia   . Hyperlipidemia   . Hypertension   . Impingement syndrome of left shoulder   .  Insomnia, unspecified   . 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 Leiomyoma of uterus, unspecified   . Menopausal and female climacteric states   . Metatarsalgia of left foot   . Mixed hyperlipidemia   . Mixed hyperlipidemia   . Pain   . PFO (patent foramen ovale)    being followed by Dr Natalie Ware problems, no surgery  . Pleurodynia   . PONV (postoperative nausea and vomiting)   . Pruritus, unspecified   . RUQ pain   . Seizures ([place])    53 years old Blood sugar really low in [place]  . Shingles   . Stroke [facility])    ? TIA 15 years ago   . TIA (transient ischemic attack)   . Transient cerebrovascular ischemia   . Trigger thumb, unspecified thumb   . Umbilical hernia without obstruction or gangrene   . Unspecified abdominal hernia without obstruction or gangrene   . Unspecified injury of left thigh, initial encounter   . Unspecified ovarian cyst, right side   . Zoster without complications     Family History  Problem Relation Age of Onset  . Colon cancer Maternal Grandfather   . Cancer Maternal Grandfather        colon  . Diabetes Mother   . Ovarian cancer Paternal Aunt   . Cancer  Paternal Aunt        ovarian    Social History   Socioeconomic History  . Marital status: Single    Spouse name: Not on file  . Number of children: 0  . Years of education: Not on file  . Highest education level: Not on file  Occupational History  . Occupation: Recruitment consultant  Social Needs  . Financial resource strain: Not on file  . Food insecurity    Worry: Not on file    Inability: Not on file  . Transportation needs    Medical: Not on file    Non-medical: Not on file  Tobacco Use  . Smoking status: Former Smoker    Quit date: 09/08/2008    Years since quitting: 10.7  . Smokeless tobacco: Never Used  Substance and Sexual Activity  . Alcohol use: Yes    Comment: social  . Drug use: No  . Sexual activity: Not Currently  Lifestyle  . Physical activity     Days per week: Not on file    Minutes per session: Not on file  . Stress: Not on file  Relationships  . Social Herbalist on phone: Not on file    Gets together: Not on file    Attends religious service: Not on file    Active member of club or organization: Not on file    Attends meetings of clubs or organizations: Not on file    Relationship status: Not on file  . Intimate partner violence    Fear of current or ex partner: Not on file    Emotionally abused: Not on file    Physically abused: Not on file    Forced sexual activity: Not on file  Other Topics Concern  . Not on file  Social History Narrative   2-3 cups caffeine daily    Past Surgical History:  Procedure Laterality Date  . cervicle fusion    . CHOLECYSTECTOMY N/A 05/24/2018   Procedure: LAPAROSCOPIC CHOLECYSTECTOMY;  Surgeon: Clovis Riley, MD;  [facility];  Service: General;  Laterality: N/A;  . FOREIGN BODY REMOVAL     right index finger  . HERNIA REPAIR     umbilical 15 [id number] ago umbilical repair Dr. Kae Ware 01-26-18  . INSERTION OF MESH N/A 01/26/2018   Procedure: INSERTION OF MESH;  Surgeon: Clovis Riley, MD;  [facility];  Service: General;  Laterality: N/A;  . TRIGGER FINGER RELEASE Right   . TRIGGER FINGER RELEASE Left 11/02/2017   Procedure: LEFT THUMB TRIGGER DIGIT RELEASE;  Surgeon: Milly Jakob, MD;  Location: [place];  Service: Orthopedics;  Laterality: Left;  . UMBILICAL HERNIA REPAIR N/A 01/26/2018   Procedure: UMBILICAL HERNIA REPAIR;  Surgeon: Clovis Riley, MD;  [facility];  Service: General;  Laterality: N/A;        Physical Exam: Blood pressure 138/68, pulse 93, height 5\' 2"  (1.575 m), weight 157 lb (71.2 kg), SpO2 98 %.    Affect appropriate Healthy:  appears stated age 4: normal Neck supple with no adenopathy JVP normal no bruits no thyromegaly Lungs clear with no wheezing and good diaphragmatic motion Heart:  S1/S2 no  murmur, no rub, gallop or click PMI normal Abdomen: benighn, BS positve, no tenderness, no AAA post lap choly no bruit.  No HSM or HJR Distal pulses intact with no bruits No edema Neuro non-focal Skin warm and dry No muscular weakness   Labs:   Lab  Results  Component Value Date   WBC 7.7 05/05/2018   HGB 14.4 05/05/2018   HCT 41.5 05/05/2018   MCV 88.5 05/05/2018   PLT 271 05/05/2018   No results for input(s): NA, K, CL, CO2, BUN, CREATININE, CALCIUM, PROT, BILITOT, ALKPHOS, ALT, AST, GLUCOSE in the last 168 hours.  Invalid input(s): LABALBU No results found for: CKTOTAL, CKMB, CKMBINDEX, TROPONINI  Lab Results  Component Value Date   CHOL 155 09/08/2011   CHOL  07/26/2008    128        ATP III CLASSIFICATION:  <200     mg/dL   Desirable  200-239  mg/dL   Borderline High  >=240    mg/dL   High   Lab Results  Component Value Date   HDL 68.90 09/08/2011   HDL 34 (L) 07/26/2008   Lab Results  Component Value Date   LDLCALC 63 09/08/2011   [place]  07/26/2008    77        Total Cholesterol/HDL:CHD Risk Coronary Heart Disease Risk Table                     Men   Women  1/2 Average Risk   3.4   3.3   Lab Results  Component Value Date   TRIG 115.0 09/08/2011   TRIG 84 07/26/2008   Lab Results  Component Value Date   CHOLHDL 2 09/08/2011   CHOLHDL 3.8 07/26/2008   No results found for: LDLDIRECT    Radiology: No results found.  EKG  05/24/18 SR limb lead reversal normal otherwise 06/24/19 SR rate 93 normal    ASSESSMENT AND PLAN:   1. Pre-operative :  Low risk surgery with no issues during general anesthesia for lap choly September of last year and multiple other higher risk surgeries clear to proceed end of this month with Dr Rosine Door  2. PFO:  No recurrent neurologic events observe 3. DM: Discussed low carb diet.  Target hemoglobin A1c is 6.5 or less.  Continue current medications. Given poorly controlled DM needs calcium score and ETT but these can be  arranged after her surgery  4. HTN:  On ARB stable low sodium diet  5. Anxiety/Depression:  Continue lexapro f/u primary   Get lipids form Dr Ernie Hew Calcium Score ETT end of November   Signed: Jenkins Rouge 06/24/2019, 3:29 PM

## 2019-06-24 ENCOUNTER — Other Ambulatory Visit: Payer: Self-pay

## 2019-06-24 ENCOUNTER — Ambulatory Visit (INDEPENDENT_AMBULATORY_CARE_PROVIDER_SITE_OTHER): Payer: BC Managed Care – PPO | Admitting: Cardiovascular Disease

## 2019-06-24 ENCOUNTER — Encounter: Payer: Self-pay | Admitting: Cardiovascular Disease

## 2019-06-24 VITALS — BP 138/68 | HR 93 | Ht 62.0 in | Wt 157.0 lb

## 2019-06-24 DIAGNOSIS — Q2112 Patent foramen ovale: Secondary | ICD-10-CM

## 2019-06-24 DIAGNOSIS — I10 Essential (primary) hypertension: Secondary | ICD-10-CM | POA: Diagnosis not present

## 2019-06-24 DIAGNOSIS — Q211 Atrial septal defect: Secondary | ICD-10-CM | POA: Diagnosis not present

## 2019-06-24 DIAGNOSIS — Z01818 Encounter for other preprocedural examination: Secondary | ICD-10-CM

## 2019-06-24 NOTE — Patient Instructions (Addendum)
Medication Instructions:   *If you need a refill on your cardiac medications before your next appointment, please call your pharmacy*  Lab Work:  If you have labs (blood work) drawn today and your tests are completely normal, you will receive your results only by: Marland Kitchen MyChart Message (if you have MyChart) OR . A paper copy in the mail If you have any lab test that is abnormal or we need to change your treatment, we will call you to review the results.  Testing/Procedures: Your physician has requested that you have an exercise tolerance test in November. For further information please visit [email or web address]. Please also follow instruction sheet, as given.  Cardiac CT scanning calcium score in November, (CAT scanning), is a noninvasive, special x-ray that produces cross-sectional images of the body using x-rays and a computer. CT scans help physicians diagnose and treat medical conditions. For some CT exams, a contrast material is used to enhance visibility in the area of the body being studied. CT scans provide greater clarity and reveal more details than regular x-ray exams.  Follow-Up: At [facility], you and your health needs are our priority.  As part of our continuing mission to provide you with exceptional heart care, we have created designated Provider Care Teams.  These Care Teams include your primary Cardiologist (physician) and Advanced Practice Providers (APPs -  Physician Assistants and Nurse Practitioners) who all work together to provide you with the care you need, when you need it.  Your next appointment:   December  The format for your next appointment:   In Person  Provider:   You may see Jenkins Rouge, MD or one of the following Advanced Practice Providers on your designated Care Team:    Truitt Merle, NP  Cecilie Kicks, NP  Kathyrn Drown, NP

## 2019-06-27 ENCOUNTER — Other Ambulatory Visit: Payer: Self-pay | Admitting: Orthopedic Surgery

## 2019-06-27 NOTE — Addendum Note (Signed)
Addended by: Jacinta Shoe on: 06/27/2019 10:57 AM   Modules accepted: Orders

## 2019-06-30 ENCOUNTER — Encounter (HOSPITAL_COMMUNITY): Payer: Self-pay | Admitting: Cardiovascular Disease

## 2019-06-30 NOTE — Patient Instructions (Addendum)
DUE TO COVID-19 ONLY ONE VISITOR IS ALLOWED TO COME WITH YOU AND STAY IN THE WAITING ROOM ONLY DURING PRE OP AND PROCEDURE DAY OF SURGERY. THE 1 VISITOR MAY VISIT WITH YOU AFTER SURGERY IN YOUR PRIVATE ROOM DURING VISITING HOURS ONLY!  YOU HAVE had A COVID 19 TEST ON_Monday 10/26/2020______ @_______ , PLEASE continue THE QUARANTINE INSTRUCTIONS AS OUTLINED IN YOUR HANDOUT.                Natalie Ware   Your procedure is scheduled on: Thursday 07/07/2019   Report to [facility] Main  Entrance    Report to admitting at 7:45AM     Call this number if you have problems the morning of surgery [phone]      Take these medicines the morning of surgery with A SIP OF WATER:  Alprazolam (Xanax), Escitalopram (Lexapro)   DO NOT TAKE ANY DIABETIC MEDICATIONS DAY OF YOUR SURGERY   How to Manage Your Diabetes  Before and After Surgery  Why is it important to control my blood sugar before and after surgery? . Improving blood sugar levels before and after surgery helps healing and can limit problems. . A way of improving blood sugar control is eating a healthy diet by: o  Eating less sugar and carbohydrates o  Increasing activity/exercise o  Talking with your doctor about reaching your blood sugar goals . High blood sugars (greater than 180 mg/dL) can raise your risk of infections and slow your recovery, so you will need to focus on controlling your diabetes during the weeks before surgery. . Make sure that the doctor who takes care of your diabetes knows about your planned surgery including the date and location.  How do I manage my blood sugar before surgery? . Check your blood sugar at least 4 times a day, starting 2 days before surgery, to make sure that the level is not too high or low. o Check your blood sugar the morning of your surgery when you wake up and every 2 hours until you get to the Short Stay unit. . If your blood sugar is less than 70 mg/dL, you will need to treat  for low blood sugar: o Do not take insulin. o Treat a low blood sugar (less than 70 mg/dL) with  cup of clear juice (cranberry or apple), 4 glucose tablets, OR glucose gel. o Recheck blood sugar in 15 minutes after treatment (to make sure it is greater than 70 mg/dL). If your blood sugar is not greater than 70 mg/dL on recheck, call [phone] for further instructions. . Report your blood sugar to the short stay nurse when you get to Short Stay.  . If you are admitted to the hospital after surgery: o Your blood sugar will be checked by the staff and you will probably be given insulin after surgery (instead of oral diabetes medicines) to make sure you have good blood sugar levels. o The goal for blood sugar control after surgery is 80-180 mg/dL.   WHAT DO I DO ABOUT MY DIABETES MEDICATION?     For patients with insulin pumps: Contact your diabetes doctor for specific instructions before surgery. Decrease basal rates by 20% at midnight the night before your surgery. Note that if your surgery is planned to be longer than 2 hours, your insulin pump will be removed and intravenous (IV) insulin will be started and managed by the nurses and the anesthesiologist. You will be able to restart your insulin pump once you are  awake and able to manage it.  Make sure to bring insulin pump supplies to the hospital with you in case the  site needs to be changed.  Patient Signature:  Date:   Nurse Signature:  Date:   Reviewed and Endorsed by [facility] Patient Education Committee, August 2015  Remember: Do not eat food after Midnight. BRUSH YOUR TEETH MORNING OF SURGERY AND RINSE YOUR MOUTH OUT, NO CHEWING GUM CANDY OR MINTS.  NO SOLID FOOD AFTER MIDNIGHT THE NIGHT PRIOR TO SURGERY. NOTHING BY MOUTH EXCEPT CLEAR LIQUIDS UNTIL 7:15am.   PLEASE FINISH ENSURE DRINK PER SURGEON ORDER  WHICH NEEDS TO BE COMPLETED AT 7:15am .   CLEAR LIQUID DIET   Foods Allowed                                                                      Foods Excluded  Coffee and tea, regular and decaf                             liquids that you cannot  Plain Jell-O any favor except red or purple             see through such as: Fruit ices (not with fruit pulp)                                     milk, soups, orange juice  Iced Popsicles                                    All solid food Carbonated beverages, regular and diet                                    Cranberry, grape and apple juices Sports drinks like Gatorade Lightly seasoned clear broth or consume(fat free) Sugar, honey syrup  Sample Menu Breakfast                                Lunch                                     Supper Cranberry juice                    Beef broth                            Chicken broth Jell-O                                     Grape juice                           Apple juice Coffee or tea  Jell-O                                      Popsicle                                                Coffee or tea                        Coffee or tea  _____________________________________________________________________                                 [name] may not have any metal on your body including hair pins and              piercings  Do not wear jewelry, make-up, lotions, powders or perfumes, deodorant             Do not wear nail polish on your fingernails.  Do not shave  48 hours prior to surgery.              Men may shave face and neck.   Do not bring valuables to the hospital. [place].  Contacts, dentures or bridgework may not be worn into surgery.  Leave suitcase in the car. After surgery it may be brought to your room.     Patients discharged the day of surgery will not be allowed to drive home. IF YOU ARE HAVING SURGERY AND GOING HOME THE  SAME DAY, YOU MUST HAVE AN ADULT TO DRIVE  YOU HOME AND BE WITH YOU FOR 24 HOURS. YOU MAY GO HOME BY   TAXI OR UBER OR ORTHERWISE, BUT AN ADULT MUST ACCOMPANY YOU HOME AND STAY WITH YOU  FOR 24 HOURS.    Name and phone number of your driver: Antonique Armendarez Husband [phone]                Please read over the following fact sheets you were given: _____________________________________________________________________             [facility] - Preparing for Surgery Before surgery, you can play an important role.  Because skin is not sterile, your skin needs to be as free of germs as possible.  You can reduce the number of germs on your skin by washing with CHG (chlorahexidine gluconate) soap before surgery.  CHG is an antiseptic cleaner which kills germs and bonds with the skin to continue killing germs even after washing. Please DO NOT use if you have an allergy to CHG or antibacterial soaps.  If your skin becomes reddened/irritated stop using the CHG and inform your nurse when you arrive at Short Stay. Do not shave (including legs and underarms) for at least 48 hours prior to the first CHG shower.  You may shave your face/neck. Please follow these instructions carefully:  1.  Shower with CHG Soap the night before surgery and the  morning of Surgery.  2.  If you choose to wash your hair, wash your hair first as usual with your  normal  shampoo.  3.  After you shampoo, rinse your hair  and body thoroughly to remove the  shampoo.                             4.  Use CHG as you would any other liquid soap.  You can apply chg directly  to the skin and wash                       Gently with a scrungie or clean washcloth.  5.  Apply the CHG Soap to your body ONLY FROM THE NECK DOWN.   Do not use on face/ open                           Wound or open sores. Avoid contact with eyes, ears mouth and genitals (private parts).                       Wash face,  Genitals (private parts) with your normal soap.             6.  Wash thoroughly, paying special attention to the area where your surgery  will be  performed.  7.  Thoroughly rinse your body with warm water from the neck down.  8.  DO NOT shower/wash with your normal soap after using and rinsing off  the CHG Soap.                9.  Pat yourself dry with a clean towel.            10.  Wear clean pajamas.            11.  Place clean sheets on your bed the night of your first shower and do not  sleep with pets. Day of Surgery : Do not apply any lotions/deodorants the morning of surgery.  Please wear clean clothes to the hospital/surgery center.  FAILURE TO FOLLOW THESE INSTRUCTIONS MAY RESULT IN THE CANCELLATION OF YOUR SURGERY PATIENT SIGNATURE_________________________________  NURSE SIGNATURE__________________________________  ________________________________________________________________________   Adam Phenix  An incentive spirometer is a tool that can help keep your lungs clear and active. This tool measures how well you are filling your lungs with each breath. Taking long deep breaths may help reverse or decrease the chance of developing breathing (pulmonary) problems (especially infection) following:  A long period of time when you are unable to move or be active. BEFORE THE PROCEDURE   If the spirometer includes an indicator to show your best effort, your nurse or respiratory therapist will set it to a desired goal.  If possible, sit up straight or lean slightly forward. Try not to slouch.  Hold the incentive spirometer in an upright position. INSTRUCTIONS FOR USE  1. Sit on the edge of your bed if possible, or sit up as far as you can in bed or on a chair. 2. Hold the incentive spirometer in an upright position. 3. Breathe out normally. 4. Place the mouthpiece in your mouth and seal your lips tightly around it. 5. Breathe in slowly and as deeply as possible, raising the piston or the ball toward the top of the column. 6. Hold your breath for 3-5 seconds or for as long as possible. Allow the piston or ball to  fall to the bottom of the column. 7. Remove the mouthpiece from your mouth and breathe out normally. 8. Rest for a few seconds and  repeat Steps 1 through 7 at least 10 times every 1-2 hours when you are awake. Take your time and take a few normal breaths between deep breaths. 9. The spirometer may include an indicator to show your best effort. Use the indicator as a goal to work toward during each repetition. 10. After each set of 10 deep breaths, practice coughing to be sure your lungs are clear. If you have an incision (the cut made at the time of surgery), support your incision when coughing by placing a pillow or rolled up towels firmly against it. Once you are able to get out of bed, walk around indoors and cough well. You may stop using the incentive spirometer when instructed by your caregiver.  RISKS AND COMPLICATIONS  Take your time so you do not get dizzy or light-headed.  If you are in pain, you may need to take or ask for pain medication before doing incentive spirometry. It is harder to take a deep breath if you are having pain. AFTER USE  Rest and breathe slowly and easily.  It can be helpful to keep track of a log of your progress. Your caregiver can provide you with a simple table to help with this. If you are using the spirometer at home, follow these instructions: [place] IF:   You are having difficultly using the spirometer.  You have trouble using the spirometer as often as instructed.  Your pain medication is not giving enough relief while using the spirometer.  You develop fever of 100.5 F (38.1 C) or higher. SEEK IMMEDIATE MEDICAL CARE IF:   You cough up bloody sputum that had not been present before.  You develop fever of 102 F (38.9 C) or greater.  You develop worsening pain at or near the incision site. MAKE SURE YOU:   Understand these instructions.  Will watch your condition.  Will get help right away if you are not doing well or get  worse. Document Released: 01/05/2007 Document Revised: 11/17/2011 Document Reviewed: 03/08/2007 [facility] Patient Information 2014 [place], [place].   ________________________________________________________________________

## 2019-06-30 NOTE — Progress Notes (Addendum)
PCP - Rachell Cipro MD Cardiologist - Jenkins Rouge MD LOV for clearance 06/24/2019  Chest x-ray -  EKG - 06/24/2019 epic Stress Test -  ECHO -  Cardiac Cath -    Sleep Study -  CPAP -   Fasting Blood Sugar -  Checks Blood Sugar _____ times a day A1C on 06/16/2019   7.5  Blood Thinner Instructions: Aspirin Instructions: 81mg  Last Dose: 06/30/2019  Anesthesia review: Chart sent to Konrad Felix PA for review  Spoke with Diabetic coordinator Crissie Figures for special instructions regarding use of insulin pump.  HX of: HTN, TIAs, DM type I, Heart Murmer, Asthma, Seizures last one 20 years ago  Patient denies shortness of breath, fever, cough and chest pain at PAT appointment   Patient verbalized understanding of instructions that were given to them at the PAT appointment. Patient was also instructed that they will need to review over the PAT instructions again at home before surgery.

## 2019-07-04 ENCOUNTER — Other Ambulatory Visit (HOSPITAL_COMMUNITY)
Admission: RE | Admit: 2019-07-04 | Discharge: 2019-07-04 | Disposition: A | Discharge status: Home / Self Care | Payer: BC Managed Care – PPO | Source: Ambulatory Visit | Attending: Orthopedic Surgery | Admitting: Orthopedic Surgery

## 2019-07-04 DIAGNOSIS — Z20828 Contact with and (suspected) exposure to other viral communicable diseases: Secondary | ICD-10-CM | POA: Insufficient documentation

## 2019-07-04 DIAGNOSIS — Z01812 Encounter for preprocedural laboratory examination: Secondary | ICD-10-CM | POA: Diagnosis not present

## 2019-07-05 ENCOUNTER — Encounter (HOSPITAL_COMMUNITY): Payer: Self-pay

## 2019-07-05 ENCOUNTER — Encounter (HOSPITAL_COMMUNITY)
Admission: RE | Admit: 2019-07-05 | Discharge: 2019-07-05 | Disposition: A | Discharge status: Home / Self Care | Payer: BC Managed Care – PPO | Source: Ambulatory Visit | Attending: Orthopedic Surgery | Admitting: Orthopedic Surgery

## 2019-07-05 ENCOUNTER — Other Ambulatory Visit: Payer: Self-pay

## 2019-07-05 DIAGNOSIS — Z7901 Long term (current) use of anticoagulants: Secondary | ICD-10-CM | POA: Insufficient documentation

## 2019-07-05 DIAGNOSIS — Z01812 Encounter for preprocedural laboratory examination: Secondary | ICD-10-CM | POA: Insufficient documentation

## 2019-07-05 DIAGNOSIS — F419 Anxiety disorder, unspecified: Secondary | ICD-10-CM | POA: Insufficient documentation

## 2019-07-05 DIAGNOSIS — I10 Essential (primary) hypertension: Secondary | ICD-10-CM | POA: Insufficient documentation

## 2019-07-05 DIAGNOSIS — Z79899 Other long term (current) drug therapy: Secondary | ICD-10-CM | POA: Diagnosis not present

## 2019-07-05 DIAGNOSIS — Z791 Long term (current) use of non-steroidal anti-inflammatories (NSAID): Secondary | ICD-10-CM | POA: Diagnosis not present

## 2019-07-05 DIAGNOSIS — Z7982 Long term (current) use of aspirin: Secondary | ICD-10-CM | POA: Insufficient documentation

## 2019-07-05 DIAGNOSIS — Z87891 Personal history of nicotine dependence: Secondary | ICD-10-CM | POA: Diagnosis not present

## 2019-07-05 DIAGNOSIS — R011 Cardiac murmur, unspecified: Secondary | ICD-10-CM | POA: Diagnosis not present

## 2019-07-05 DIAGNOSIS — M94212 Chondromalacia, left shoulder: Secondary | ICD-10-CM | POA: Insufficient documentation

## 2019-07-05 DIAGNOSIS — E785 Hyperlipidemia, unspecified: Secondary | ICD-10-CM | POA: Insufficient documentation

## 2019-07-05 DIAGNOSIS — E118 Type 2 diabetes mellitus with unspecified complications: Secondary | ICD-10-CM | POA: Insufficient documentation

## 2019-07-05 DIAGNOSIS — E875 Hyperkalemia: Secondary | ICD-10-CM | POA: Diagnosis not present

## 2019-07-05 DIAGNOSIS — F329 Major depressive disorder, single episode, unspecified: Secondary | ICD-10-CM | POA: Insufficient documentation

## 2019-07-05 DIAGNOSIS — Z794 Long term (current) use of insulin: Secondary | ICD-10-CM | POA: Insufficient documentation

## 2019-07-05 DIAGNOSIS — E782 Mixed hyperlipidemia: Secondary | ICD-10-CM | POA: Insufficient documentation

## 2019-07-05 LAB — BASIC METABOLIC PANEL
Anion gap: 8 (ref 5–15)
BUN: 19 mg/dL (ref 6–20)
CO2: 26 mmol/L (ref 22–32)
Calcium: 9.2 mg/dL (ref 8.9–10.3)
Chloride: 101 mmol/L (ref 98–111)
Creatinine, Ser: 0.82 mg/dL (ref 0.44–1.00)
GFR calc Af Amer: >60 mL/min (ref >60)
GFR calc non Af Amer: >60 mL/min (ref >60)
Glucose, Bld: 214 mg/dL — ABNORMAL HIGH (ref 70–99)
Potassium: 4.2 mmol/L (ref 3.5–5.1)
Sodium: 135 mmol/L (ref 135–145)

## 2019-07-05 LAB — CBC
HCT: 42.2 % (ref 36.0–46.0)
Hemoglobin: 13.6 g/dL (ref 12.0–15.0)
MCH: 30.4 pg (ref 26.0–34.0)
MCHC: 32.2 g/dL (ref 30.0–36.0)
MCV: 94.2 fL (ref 80.0–100.0)
Platelets: 292 10*3/uL (ref 150–400)
RBC: 4.48 MIL/uL (ref 3.87–5.11)
RDW: 13.2 % (ref 11.5–15.5)
WBC: 8.8 10*3/uL (ref 4.0–10.5)
nRBC: 0 % (ref 0.0–0.2)

## 2019-07-05 LAB — NOVEL CORONAVIRUS, NAA (HOSP ORDER, SEND-OUT TO REF LAB; TAT 18-24 HRS): SARS-CoV-2, NAA: NOT DETECTED

## 2019-07-05 LAB — GLUCOSE, CAPILLARY: Glucose-Capillary: 303 mg/dL — ABNORMAL HIGH (ref 70–99)

## 2019-07-05 NOTE — Progress Notes (Signed)
Nurse and patient both spoke with Diabetic coordinator Crissie Figures for special instructions regarding use of insulin pump.

## 2019-07-06 NOTE — Anesthesia Preprocedure Evaluation (Addendum)
Anesthesia Evaluation  Patient identified by MRN, date of birth, ID band Patient awake    Reviewed: Allergy & Precautions, NPO status , Patient's Chart, lab work & pertinent test results  History of Anesthesia Complications (+) PONV and history of anesthetic complications  Airway Mallampati: II  TM Distance: >3 FB Neck ROM: Full    Dental no notable dental hx. (+) Teeth Intact   Pulmonary shortness of breath and with exertion, Patient abstained from smoking., former smoker,    Pulmonary exam normal breath sounds clear to auscultation       Cardiovascular hypertension, Pt. on medications Normal cardiovascular exam+ Valvular Problems/Murmurs  Rhythm:Regular Rate:Normal     Neuro/Psych  Headaches, Seizures -, Well Controlled,  PSYCHIATRIC DISORDERS Anxiety Depression TIACVA    GI/Hepatic Neg liver ROS, GERD  Medicated and Controlled,  Endo/Other  diabetes, Poorly Controlled, Type 1, Insulin DependentHyperlipidemia  Renal/GU negative Renal ROS  negative genitourinary   Musculoskeletal  (+) Arthritis , Osteoarthritis,  Impingement syndrome left shoulder   Abdominal   Peds  Hematology negative hematology ROS (+)   Anesthesia Other Findings   Reproductive/Obstetrics                           Anesthesia Physical Anesthesia Plan  ASA: III  Anesthesia Plan: General   Post-op Pain Management:  Regional for Post-op pain   Induction: Intravenous  PONV Risk Score and Plan: 4 or greater and Ondansetron, Treatment may vary due to age or medical condition, Midazolam and Scopolamine patch - Pre-op  Airway Management Planned: Oral ETT  Additional Equipment:   Intra-op Plan:   Post-operative Plan:   Informed Consent: I have reviewed the patients History and Physical, chart, labs and discussed the procedure including the risks, benefits and alternatives for the proposed anesthesia with the patient  or authorized representative who has indicated his/her understanding and acceptance.     Dental advisory given  Plan Discussed with: CRNA and Surgeon  Anesthesia Plan Comments: (PAT note written 07/06/2019 by Myra Gianotti, PA-C. )       Anesthesia Quick Evaluation

## 2019-07-06 NOTE — Progress Notes (Addendum)
Anesthesia Chart Review:  Case: [id number] Date/Time: 07/07/19 1000   Procedure: ARTHROSCOPY SHOULDER DEBRIDEMENT CHONDROMALACIA (Left Shoulder)   Anesthesia type: Choice   Pre-op diagnosis: LEFT SHOULDER CHONDROMALACIA   Location: [place] [facility]   Surgeon: [name], MD      DISCUSSION: Patient is a 53 year old female scheduled for the above procedure.  History includes former smoker (quit 2010; some vaping per 06/24/19 notes), post-operative N/V, HTN, murmur, PFO (moderate sized PFO with right-to-left shunting found in 2009 in setting of possible TIA; declined closure due to Nickel allergy 2010; re-evaluate plan if has recurrent neurologic event), TIA (07/2008; brain MRI negative for acute ischemia 07/27/08 MRI), GERD, HLD, DM1 (history of seizure from hypoglycemia > 10 years ago), pleurodynia, cervical fusion, cholecystectomy ([id number]), umbilical hernia repair (01/26/18).  Patient seen by cardiologist Dr. Johnsie Cancel on 06/24/19 for preoperative evaluation. (Referred by surgeon due to PFO history with last cardiology follow-up in 2017.). She was found to have a PFO in 2010 when presented with possible TIA with slurred speech, some confusion, and right arm paresthesias. MRI brain was negative. She apparently was seen by neurologist Antony Contras, MD and cardiologists Adrian Prows, MD and Dr. Johnsie Cancel for PFO management recommendations. According to Dr. Kyla Balzarine 04/03/09 note, She "is allergic to nickel and did not want to be randomized for closure device.  Think she had some valid points.  In general I told her we usually don't recommend closure unless there was a second episode of unexplained neurological events." She has not had a recurrent neurologist event, so she has not been referred for PFO closure.   Preoperative evaluation per Dr. Johnsie Cancel on 06/24/19 includes: "1. Pre-operative :  Low risk surgery with no issues during general anesthesia for lap choly September of last year and multiple  other higher risk surgeries clear to proceed end of this month with Dr Rosine Door  2. PFO:  No recurrent neurologic events observe 3. DM: Discussed low carb diet.  Target hemoglobin A1c is 6.5 or less.  Continue current medications. Given poorly controlled DM needs calcium score and ETT but these can be arranged after her surgery".   Last ASA 06/30/19. PST RN notified DM Coordinator since patient has an insulin pump.   07/04/19 COVID-19 test negative.  Based on currently available information I would anticipate that she could proceed as planned if no acute changes.  Anesthesia team to evaluate on the day of surgery. Will also get CBG on arrival.   VS: BP 140/78 (BP Location: Left Arm)   Pulse 82   Temp 37.1 C (Oral)   Resp 18   Ht 5\' 2"  (1.575 m)   Wt 73.3 kg   LMP 11/06/2017 (Within Months) Comment: has not had period in 4-6 months   SpO2 99%   BMI 29.55 kg/m   PROVIDERS: [name], MD is PCP Jenkins Rouge, MD is cardiologist   LABS: Preoperative labs noted. Non-fasting CBG 303, glucose on BMET 214; however,  A1c 06/16/19 was 7.5%. CBC WNL.  (all labs ordered are listed, but only abnormal results are displayed)  Labs Reviewed  GLUCOSE, CAPILLARY - Abnormal; Notable for the following components:      Result Value   Glucose-Capillary 303 (*)    All other components within normal limits  BASIC METABOLIC PANEL - Abnormal; Notable for the following components:   Glucose, Bld 214 (*)    All other components within normal limits  CBC    EKG: 06/24/19: NSR   CV:  ETT 01/18/16:  There was no ST segment deviation noted during stress.  Negative, adequate stress test.    Carotid US 01/18/16:  Impression: Normal carotid arteries, bilaterally. Normal subclavian arteries, bilaterally. Patent vertebral arteries with antegrade flow.   Nuclear stress test 04/09/09: Normal, EF 70%.    TEE 11/16/08: SUMMARY  - Overall left ventricular systolic function was normal.  -  Interatrial septum with moderate sized PFO and right to left     shunting with valsalva only. More than 20 bubbles crossed     with Valsalva strain. No shunting at rest. The left atrial     appendage function was normal (normal emptying velocity).     There was no left atrial appendage thrombus identified.  IMPRESSIONS  - There was diagnostic echocardiographic evidence to suggest PFO to     be a potential cardiac source of embolism.    Past Medical History:  Diagnosis Date  . Acute bronchitis   . Acute upper respiratory infection   . Allergic rhinitis   . Anxiety   . Body mass index 28.0-28.9, adult   . Chondrocostal junction syndrome   . Chondromalacia of left shoulder   . CN (constipation)   . Constipation   . Cough   . Defect, atrial septal   . Dehydration   . Depression   . Diarrhea   . DM (diabetes mellitus) ([place])   . Dorsalgia   . GERD (gastroesophageal reflux disease)   . Head ache   . Heart murmur   . Hemangioma of intra-abdominal structures   . Hematuria   . Hyperkalemia   . Hyperlipidemia   . Hypertension   . Impingement syndrome of left shoulder   . Insomnia, unspecified   . 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 Leiomyoma of uterus, unspecified   . Menopausal and female climacteric states   . Metatarsalgia of left foot   . Mixed hyperlipidemia   . Mixed hyperlipidemia   . Pain   . PFO (patent foramen ovale)    being followed by Dr Andrez Grime problems, no surgery  . Pleurodynia   . PONV (postoperative nausea and vomiting)   . Pruritus, unspecified   . RUQ pain   . Seizures ([place])    53 years old Blood sugar really low in [place]  . Shingles   . Stroke [facility])    ? TIA 15 years ago   . TIA (transient ischemic attack)   . Transient cerebrovascular ischemia   . Trigger thumb, unspecified thumb   . Umbilical hernia without obstruction or gangrene   . Unspecified abdominal hernia without obstruction or  gangrene   . Unspecified injury of left thigh, initial encounter   . Unspecified ovarian cyst, right side   . Zoster without complications     Past Surgical History:  Procedure Laterality Date  . cervicle fusion    . CHOLECYSTECTOMY N/A 05/24/2018   Procedure: LAPAROSCOPIC CHOLECYSTECTOMY;  Surgeon: Clovis Riley, MD;  Location: [facility];  Service: General;  Laterality: N/A;  . FOREIGN BODY REMOVAL     right index finger  . HERNIA REPAIR     umbilical 15 [id number] ago umbilical repair Dr. Kae Heller 01-26-18  . INSERTION OF MESH N/A 01/26/2018   Procedure: INSERTION OF MESH;  Surgeon: Clovis Riley, MD;  Location: [facility];  Service: General;  Laterality: N/A;  . NECK SURGERY  2017  . TRIGGER FINGER RELEASE Right   . TRIGGER FINGER RELEASE  Left 11/02/2017   Procedure: LEFT THUMB TRIGGER DIGIT RELEASE;  Surgeon: Milly Jakob, MD;  Location: [place];  Service: Orthopedics;  Laterality: Left;  . UMBILICAL HERNIA REPAIR N/A 01/26/2018   Procedure: UMBILICAL HERNIA REPAIR;  Surgeon: Clovis Riley, MD;  Location: [facility];  Service: General;  Laterality: N/A;    MEDICATIONS: . acetaminophen (TYLENOL) 325 MG tablet  . ALPRAZolam (XANAX) 0.5 MG tablet  . aspirin 81 MG EC tablet  . Continuous Blood Gluc Transmit (DEXCOM G6 TRANSMITTER) MISC  . escitalopram (LEXAPRO) 20 MG tablet  . ibuprofen (ADVIL) 200 MG tablet  . Insulin Human (INSULIN PUMP) SOLN  . losartan (COZAAR) 100 MG tablet  . meloxicam (MOBIC) 7.5 MG tablet  . montelukast (SINGULAIR) 10 MG tablet  . NOVOLOG 100 UNIT/ML injection   No current facility-administered medications for this encounter.      Myra Gianotti, PA-C Surgical Short Stay/Anesthesiology [facility] Phone [phone] [facility] Phone [phone] 07/06/2019 10:50 AM

## 2019-07-07 ENCOUNTER — Ambulatory Visit (HOSPITAL_COMMUNITY): Payer: BC Managed Care – PPO | Admitting: Emergency Medicine

## 2019-07-07 ENCOUNTER — Encounter (HOSPITAL_COMMUNITY): Payer: Self-pay

## 2019-07-07 ENCOUNTER — Ambulatory Visit (HOSPITAL_COMMUNITY): Payer: BC Managed Care – PPO | Admitting: Vascular Surgery

## 2019-07-07 ENCOUNTER — Ambulatory Visit (HOSPITAL_COMMUNITY)
Admission: RE | Admit: 2019-07-07 | Discharge: 2019-07-07 | Disposition: A | Discharge status: Home / Self Care | Payer: BC Managed Care – PPO | Source: Ambulatory Visit | Attending: Orthopedic Surgery | Admitting: Orthopedic Surgery

## 2019-07-07 ENCOUNTER — Encounter (HOSPITAL_COMMUNITY)
Admission: RE | Disposition: A | Discharge status: Home / Self Care | Payer: Self-pay | Source: Ambulatory Visit | Attending: Orthopedic Surgery

## 2019-07-07 DIAGNOSIS — Z9641 Presence of insulin pump (external) (internal): Secondary | ICD-10-CM | POA: Diagnosis not present

## 2019-07-07 DIAGNOSIS — Q211 Atrial septal defect: Secondary | ICD-10-CM | POA: Diagnosis not present

## 2019-07-07 DIAGNOSIS — Z8673 Personal history of transient ischemic attack (TIA), and cerebral infarction without residual deficits: Secondary | ICD-10-CM | POA: Diagnosis not present

## 2019-07-07 DIAGNOSIS — Z79899 Other long term (current) drug therapy: Secondary | ICD-10-CM | POA: Insufficient documentation

## 2019-07-07 DIAGNOSIS — Z87891 Personal history of nicotine dependence: Secondary | ICD-10-CM | POA: Diagnosis not present

## 2019-07-07 DIAGNOSIS — M94212 Chondromalacia, left shoulder: Secondary | ICD-10-CM | POA: Insufficient documentation

## 2019-07-07 DIAGNOSIS — Z794 Long term (current) use of insulin: Secondary | ICD-10-CM | POA: Diagnosis not present

## 2019-07-07 DIAGNOSIS — Z791 Long term (current) use of non-steroidal anti-inflammatories (NSAID): Secondary | ICD-10-CM | POA: Diagnosis not present

## 2019-07-07 DIAGNOSIS — M7542 Impingement syndrome of left shoulder: Secondary | ICD-10-CM | POA: Insufficient documentation

## 2019-07-07 DIAGNOSIS — I10 Essential (primary) hypertension: Secondary | ICD-10-CM | POA: Diagnosis not present

## 2019-07-07 DIAGNOSIS — M1990 Unspecified osteoarthritis, unspecified site: Secondary | ICD-10-CM | POA: Diagnosis not present

## 2019-07-07 DIAGNOSIS — E109 Type 1 diabetes mellitus without complications: Secondary | ICD-10-CM | POA: Insufficient documentation

## 2019-07-07 DIAGNOSIS — F329 Major depressive disorder, single episode, unspecified: Secondary | ICD-10-CM | POA: Insufficient documentation

## 2019-07-07 DIAGNOSIS — F419 Anxiety disorder, unspecified: Secondary | ICD-10-CM | POA: Diagnosis not present

## 2019-07-07 DIAGNOSIS — Z7982 Long term (current) use of aspirin: Secondary | ICD-10-CM | POA: Diagnosis not present

## 2019-07-07 DIAGNOSIS — E782 Mixed hyperlipidemia: Secondary | ICD-10-CM | POA: Diagnosis not present

## 2019-07-07 DIAGNOSIS — G8918 Other acute postprocedural pain: Secondary | ICD-10-CM | POA: Diagnosis not present

## 2019-07-07 HISTORY — PX: SHOULDER ARTHROSCOPY: SHX128

## 2019-07-07 LAB — GLUCOSE, CAPILLARY
Glucose-Capillary: 231 mg/dL — ABNORMAL HIGH (ref 70–99)
Glucose-Capillary: 150 mg/dL — ABNORMAL HIGH (ref 70–99)
Glucose-Capillary: 114 mg/dL — ABNORMAL HIGH (ref 70–99)

## 2019-07-07 SURGERY — ARTHROSCOPY, SHOULDER
Anesthesia: General | Site: Shoulder | Laterality: Left

## 2019-07-07 MED ORDER — LIDOCAINE 2% (20 MG/ML) 5 ML SYRINGE
INTRAMUSCULAR | Status: AC
  Filled 2019-07-07: qty 5

## 2019-07-07 MED ORDER — SOD CITRATE-CITRIC ACID 500-334 MG/5ML PO SOLN
30.0000 mL | Freq: Once | ORAL | Status: AC
  Administered 2019-07-07: 30 mL via ORAL

## 2019-07-07 MED ORDER — LIDOCAINE 2% (20 MG/ML) 5 ML SYRINGE
INTRAMUSCULAR | Status: DC | PRN
  Administered 2019-07-07: 80 mg via INTRAVENOUS

## 2019-07-07 MED ORDER — FENTANYL CITRATE (PF) 100 MCG/2ML IJ SOLN
INTRAMUSCULAR | Status: AC
  Filled 2019-07-07: qty 2

## 2019-07-07 MED ORDER — LACTATED RINGERS IV SOLN
INTRAVENOUS | Status: DC
  Administered 2019-07-07: 08:00:00 via INTRAVENOUS

## 2019-07-07 MED ORDER — PROPOFOL 10 MG/ML IV BOLUS
INTRAVENOUS | Status: DC | PRN
  Administered 2019-07-07: 150 mg via INTRAVENOUS

## 2019-07-07 MED ORDER — SOD CITRATE-CITRIC ACID 500-334 MG/5ML PO SOLN
ORAL | Status: AC
  Filled 2019-07-07: qty 30

## 2019-07-07 MED ORDER — SOD CITRATE-CITRIC ACID 500-334 MG/5ML PO SOLN: 30.0000 mL | Freq: Three times a day (TID) | ORAL | Status: DC

## 2019-07-07 MED ORDER — SODIUM CHLORIDE 0.9 % IR SOLN
Status: DC | PRN
  Administered 2019-07-07: 6000 mL

## 2019-07-07 MED ORDER — ROCURONIUM BROMIDE 10 MG/ML (PF) SYRINGE
PREFILLED_SYRINGE | INTRAVENOUS | Status: DC | PRN
  Administered 2019-07-07: 50 mg via INTRAVENOUS

## 2019-07-07 MED ORDER — SCOPOLAMINE 1 MG/3DAYS TD PT72
1.0000 | MEDICATED_PATCH | TRANSDERMAL | Status: DC
  Administered 2019-07-07: 09:00:00 1.5 mg via TRANSDERMAL

## 2019-07-07 MED ORDER — PROPOFOL 10 MG/ML IV BOLUS
INTRAVENOUS | Status: AC
  Filled 2019-07-07: qty 20

## 2019-07-07 MED ORDER — CHLORHEXIDINE GLUCONATE 4 % EX LIQD: 60.0000 mL | Freq: Once | CUTANEOUS | Status: DC

## 2019-07-07 MED ORDER — SUGAMMADEX SODIUM 200 MG/2ML IV SOLN
INTRAVENOUS | Status: DC | PRN
  Administered 2019-07-07: 200 mg via INTRAVENOUS

## 2019-07-07 MED ORDER — BUPIVACAINE HCL 0.5 % IJ SOLN
INTRAMUSCULAR | Status: DC | PRN
  Administered 2019-07-07: 15 mL

## 2019-07-07 MED ORDER — FENTANYL CITRATE (PF) 100 MCG/2ML IJ SOLN
INTRAMUSCULAR | Status: DC | PRN
  Administered 2019-07-07: 50 ug via INTRAVENOUS

## 2019-07-07 MED ORDER — CEFAZOLIN SODIUM-DEXTROSE 2-4 GM/100ML-% IV SOLN
2.0000 g | INTRAVENOUS | Status: AC
  Administered 2019-07-07: 2 g via INTRAVENOUS
  Filled 2019-07-07: qty 100

## 2019-07-07 MED ORDER — SCOPOLAMINE 1 MG/3DAYS TD PT72
MEDICATED_PATCH | TRANSDERMAL | Status: AC
  Administered 2019-07-07: 1.5 mg via TRANSDERMAL
  Filled 2019-07-07: qty 1

## 2019-07-07 MED ORDER — OXYCODONE-ACETAMINOPHEN 5-325 MG PO TABS: 1.0000 | ORAL_TABLET | ORAL | 0 refills | Status: AC | PRN

## 2019-07-07 MED ORDER — ROCURONIUM BROMIDE 10 MG/ML (PF) SYRINGE
PREFILLED_SYRINGE | INTRAVENOUS | Status: AC
  Filled 2019-07-07: qty 10

## 2019-07-07 MED ORDER — BUPIVACAINE LIPOSOME 1.3 % IJ SUSP
INTRAMUSCULAR | Status: DC | PRN
  Administered 2019-07-07: 10 mL via PERINEURAL

## 2019-07-07 MED ORDER — ONDANSETRON HCL 4 MG/2ML IJ SOLN
INTRAMUSCULAR | Status: AC
  Filled 2019-07-07: qty 2

## 2019-07-07 MED ORDER — FENTANYL CITRATE (PF) 100 MCG/2ML IJ SOLN
50.0000 ug | INTRAMUSCULAR | Status: DC
  Administered 2019-07-07: 50 ug via INTRAVENOUS
  Filled 2019-07-07: qty 2

## 2019-07-07 MED ORDER — MIDAZOLAM HCL 2 MG/2ML IJ SOLN
1.0000 mg | INTRAMUSCULAR | Status: DC
  Administered 2019-07-07 (×2): 1 mg via INTRAVENOUS
  Filled 2019-07-07: qty 2

## 2019-07-07 MED ORDER — ONDANSETRON HCL 4 MG/2ML IJ SOLN
INTRAMUSCULAR | Status: DC | PRN
  Administered 2019-07-07: 4 mg via INTRAVENOUS

## 2019-07-07 MED ORDER — DEXAMETHASONE SODIUM PHOSPHATE 10 MG/ML IJ SOLN
INTRAMUSCULAR | Status: AC
  Filled 2019-07-07: qty 1

## 2019-07-07 SURGICAL SUPPLY — 65 items
AID PSTN UNV HD RSTRNT DISP (MISCELLANEOUS)
BOOTIES KNEE HIGH SLOAN (MISCELLANEOUS) ×4 IMPLANT
BURR OVAL 8 FLU 4.0X13 (MISCELLANEOUS) ×2 IMPLANT
CANNULA 5.75X7 CRYSTAL CLEAR (CANNULA) ×2 IMPLANT
CANNULA 5.75X71 LONG (CANNULA) IMPLANT
CANNULA TWIST IN 8.25X7CM (CANNULA) IMPLANT
COOLER ICEMAN CLASSIC (MISCELLANEOUS) IMPLANT
COVER SURGICAL LIGHT HANDLE (MISCELLANEOUS) ×2 IMPLANT
COVER WAND RF STERILE (DRAPES) IMPLANT
CUTTER BONE 4.0MM X 13CM (MISCELLANEOUS) ×2 IMPLANT
DISSECTOR  3.8MM X 13CM (MISCELLANEOUS)
DISSECTOR 3.8MM X 13CM (MISCELLANEOUS) IMPLANT
DRAPE IMP U-DRAPE 54X76 (DRAPES) ×2 IMPLANT
DRAPE INCISE IOBAN 66X45 STRL (DRAPES) IMPLANT
DRAPE ORTHO SPLIT 77X108 STRL (DRAPES) ×4
DRAPE STERI 35X30 U-POUCH (DRAPES) ×2 IMPLANT
DRAPE SURG ORHT 6 SPLT 77X108 (DRAPES) ×2 IMPLANT
DRAPE U-SHAPE 47X51 STRL (DRAPES) ×4 IMPLANT
DRSG PAD ABDOMINAL 8X10 ST (GAUZE/BANDAGES/DRESSINGS) ×4 IMPLANT
DURAPREP 26ML APPLICATOR (WOUND CARE) ×2 IMPLANT
ELECT REM PT RETURN 15FT ADLT (MISCELLANEOUS) ×2 IMPLANT
GAUZE SPONGE 4X4 12PLY STRL (GAUZE/BANDAGES/DRESSINGS) ×2 IMPLANT
GAUZE XEROFORM 1X8 LF (GAUZE/BANDAGES/DRESSINGS) ×2 IMPLANT
GLOVE BIO SURGEON STRL SZ7.5 (GLOVE) ×2 IMPLANT
GLOVE BIOGEL PI IND STRL 7.0 (GLOVE) ×1 IMPLANT
GLOVE BIOGEL PI IND STRL 8 (GLOVE) ×1 IMPLANT
GLOVE BIOGEL PI INDICATOR 7.0 (GLOVE) ×1
GLOVE BIOGEL PI INDICATOR 8 (GLOVE) ×1
GLOVE SURG SS PI 7.0 STRL IVOR (GLOVE) ×2 IMPLANT
GOWN STRL REUS W/TWL LRG LVL3 (GOWN DISPOSABLE) ×2 IMPLANT
GOWN STRL REUS W/TWL XL LVL3 (GOWN DISPOSABLE) ×2 IMPLANT
KIT BASIN OR (CUSTOM PROCEDURE TRAY) ×2 IMPLANT
KIT PUSHLOCK 2.9 HIP (KITS) IMPLANT
KIT STR SPEAR 1.8 FBRTK DISP (KITS) IMPLANT
KIT TURNOVER KIT A (KITS) IMPLANT
LASSO 90 CVE QUICKPAS (DISPOSABLE) IMPLANT
LASSO CRESCENT QUICKPASS (SUTURE) IMPLANT
MANIFOLD NEPTUNE II (INSTRUMENTS) ×2 IMPLANT
NDL HYPO 25X1 1.5 SAFETY (NEEDLE) IMPLANT
NDL SCORPION MULTI FIRE (NEEDLE) IMPLANT
NEEDLE HYPO 25X1 1.5 SAFETY (NEEDLE) IMPLANT
NEEDLE SCORPION MULTI FIRE (NEEDLE) IMPLANT
PACK ARTHROSCOPY DSU (CUSTOM PROCEDURE TRAY) ×2 IMPLANT
PAD COLD SHLDR WRAP-ON (PAD) IMPLANT
PROBE BIPOLAR ATHRO 135MM 90D (MISCELLANEOUS) ×2 IMPLANT
PROTECTOR NERVE ULNAR (MISCELLANEOUS) ×2 IMPLANT
RESTRAINT HEAD UNIVERSAL NS (MISCELLANEOUS) IMPLANT
SLING ARM FOAM STRAP LRG (SOFTGOODS) IMPLANT
SLING ARM FOAM STRAP MED (SOFTGOODS) IMPLANT
SLING ARM IMMOBILIZER LRG (SOFTGOODS) IMPLANT
SLING ARM IMMOBILIZER MED (SOFTGOODS) ×1 IMPLANT
SUPPORT WRAP ARM LG (MISCELLANEOUS) ×2 IMPLANT
SUT ETHILON 3 0 PS 1 (SUTURE) ×2 IMPLANT
SUT PDS AB 1 CT1 27 (SUTURE) IMPLANT
SUT TIGER TAPE 7 IN WHITE (SUTURE) IMPLANT
SUTURE TAPE 1.3 40 TPR END (SUTURE) IMPLANT
SUTURETAPE 1.3 40 TPR END (SUTURE)
TAPE CLOTH SURG 6X10 WHT LF (GAUZE/BANDAGES/DRESSINGS) ×1 IMPLANT
TAPE FIBER 2MM 7IN #2 BLUE (SUTURE) IMPLANT
TAPE LABRALWHITE 1.5X36 (TAPE) IMPLANT
TAPE SUT LABRALTAP WHT/BLK (SUTURE) IMPLANT
TOWEL OR 17X26 10 PK STRL BLUE (TOWEL DISPOSABLE) ×2 IMPLANT
TOWEL OR NON WOVEN STRL DISP B (DISPOSABLE) ×2 IMPLANT
TUBING ARTHROSCOPY IRRIG 16FT (MISCELLANEOUS) ×2 IMPLANT
TUBING CONNECTING 10 (TUBING) ×4 IMPLANT

## 2019-07-07 NOTE — Op Note (Signed)
Procedure(s): ARTHROSCOPY SHOULDER DEBRIDEMENT CHONDROMALACIA Procedure Note  Natalie Ware female 53 y.o. 07/07/2019  Preoperative diagnosis: Left shoulder chondromalacia  Postoperative diagnosis: Same  Procedure(s) and Anesthesia Type:    ARTHROSCOPY SHOULDER DEBRIDEMENT CHONDROMALACIA with microfracture of the glenoid  Surgeon(s) and Role:    [name], MD - Primary     Surgeon: Isabella Stalling   Assistants: Jeanmarie Hubert PA-C [facility] was present and scrubbed throughout the procedure and was essential in positioning, assisting with the camera and instrumentation,, and closure)  Anesthesia: General endotracheal anesthesia with preoperative interscalene block given by the attending anesthesiologist     Procedure Detail  ARTHROSCOPY SHOULDER DEBRIDEMENT CHONDROMALACIA  Estimated Blood Loss: Min         Drains: none  Blood Given: none         Specimens: none        Complications:  * No complications entered in OR log *         Disposition: PACU - hemodynamically stable.         Condition: stable    Procedure:   INDICATIONS FOR SURGERY: The patient is 53 y.o. female who has had a long history of left shoulder pain which is failed conservative management with rest, home exercises and injections as well as NSAIDs.  Indicated for surgical treatment to try and decrease pain and improve function.  OPERATIVE FINDINGS: Examination under anesthesia: No significant stiffness or instability.   DESCRIPTION OF PROCEDURE: The patient was identified in preoperative  holding area where I personally marked the operative site after  verifying site, side, and procedure with the patient. An interscalene block was given by the attending anesthesiologist the holding area.  The patient was taken back to the operating room where general anesthesia was induced without complication and was placed in the beach-chair position with the back  elevated about 60 degrees and  all extremities and head and neck carefully padded and  positioned.   The left upper extremity was then prepped and  draped in a standard sterile fashion. The appropriate time-out  procedure was carried out. The patient did receive IV antibiotics  within 30 minutes of incision.   A small posterior portal incision was made and the arthroscope was introduced into the joint. An anterior portal was then established above the subscapularis using needle localization. Small cannula was placed anteriorly. Diagnostic arthroscopy was then carried out.  The subscapularis was noted to be completely intact.  There was some synovitis in the rotator interval which was debrided back with a shaver and ArthroCare.  She had elevation of the superior labrum consistent consistent with type I SLAP tear, debrided back to healthy labrum.  The biceps anchor was not involved. The supraspinatus and infraspinatus were completely intact.  The biceps tendon looked healthy.  The cartilage on the humeral head was completely intact and pristine appearing.  The glenoid had a large central defect with grade 4 cartilage loss with surrounding areas where the cartilage was detached in easily flipped up. Using a ring curette and the shaver I was able to get back to intact cartilage and create stable shoulders on the lesion.  The final lesion size was 8 x 12 mm.  The ring curette was used to scrape the subchondral bone down to a bleeding surface and then the 30 degree micro pick was used to create for 5 small microfracture holes through subchondral bone allowing bleeding to promote fibrocartilage formation.  The arthroscope was then introduced into the  subacromial space and there was no signs of impingement or inflammation.  The rotator cuff was intact from the bursal side.  The arthroscopic equipment was removed from the joint and the portals were closed with 3-0 nylon in an interrupted fashion. Sterile dressings were then applied  including Xeroform 4 x 4's ABDs and tape. The patient was then allowed to awaken from general anesthesia, placed in a sling, transferred to the stretcher and taken to the recovery room in stable condition.   POSTOPERATIVE PLAN: The patient will be discharged home today and will followup in one week for suture removal and wound check.

## 2019-07-07 NOTE — Discharge Instructions (Signed)
Discharge Instructions after Arthroscopic Shoulder Surgery   A sling has been provided for you. You may remove the sling after 72 hours. The sling may be worn for your protection, if you are in a crowd.  Use ice on the shoulder intermittently over the first 48 hours after surgery.  Pain medication has been prescribed for you.  Use your medication liberally over the first 48 hours, and then begin to taper your use. You may take Extra Strength Tylenol or Tylenol only in place of the pain pills. DO NOT take ANY nonsteroidal anti-inflammatory pain medications: Advil, Motrin, Ibuprofen, Aleve, Naproxen, or Naprosyn.  You may remove your dressing after two days.  You may shower 5 days after surgery. The incision CANNOT get wet prior to 5 days. Simply allow the water to wash over the site and then pat dry. Do not rub the incision. Make sure your axilla (armpit) is completely dry after showering.  Take one aspirin a day for 2 weeks after surgery, unless you have an aspirin sensitivity/allergy or asthma.  Three to 5 times each day you should perform assisted overhead reaching and external rotation (outward turning) exercises with the operative arm. Both exercises should be done with the non-operative arm used as the "therapist arm" while the operative arm remains relaxed. Ten of each exercise should be done three to five times each day.    Overhead reach is helping to lift your stiff arm up as high as it will go. To stretch your overhead reach, lie flat on your back, relax, and grasp the wrist of the tight shoulder with your opposite hand. Using the power in your opposite arm, bring the stiff arm up as far as it is comfortable. Start holding it for ten seconds and then work up to where you can hold it for a count of 30. Breathe slowly and deeply while the arm is moved. Repeat this stretch ten times, trying to help the arm up a little higher each time.       External rotation is turning the arm out to  the side while your elbow stays close to your body. External rotation is best stretched while you are lying on your back. Hold a cane, yardstick, broom handle, or dowel in both hands. Bend both elbows to a right angle. Use steady, gentle force from your normal arm to rotate the hand of the stiff shoulder out away from your body. Continue the rotation as far as it will go comfortably, holding it there for a count of 10. Repeat this exercise ten times.     Please call [phone] during normal business hours or [phone] after hours for any problems. Including the following:  - excessive redness of the incisions - drainage for more than 4 days - fever of more than 101.5 F  *Please note that pain medications will not be refilled after hours or on weekends.   General Anesthesia, Adult, Care After This sheet gives you information about how to care for yourself after your procedure. Your health care provider may also give you more specific instructions. If you have problems or questions, contact your health care provider. What can I expect after the procedure? After the procedure, the following side effects are common:  Pain or discomfort at the IV site.  Nausea.  Vomiting.  Sore throat.  Trouble concentrating.  Feeling cold or chills.  Weak or tired.  Sleepiness and fatigue.  Soreness and body aches. These side effects can affect parts of the  body that were not involved in surgery. Follow these instructions at home:  For at least 24 hours after the procedure:  Have a responsible adult stay with you. It is important to have someone help care for you until you are awake and alert.  Rest as needed.  Do not: ? Participate in activities in which you could fall or become injured. ? Drive. ? Use heavy machinery. ? Drink alcohol. ? Take sleeping pills or medicines that cause drowsiness. ? Make important decisions or sign legal documents. ? Take care of children on your  own. Eating and drinking  Follow any instructions from your health care provider about eating or drinking restrictions.  When you feel hungry, start by eating small amounts of foods that are soft and easy to digest (bland), such as toast. Gradually return to your regular diet.  Drink enough fluid to keep your urine pale yellow.  If you vomit, rehydrate by drinking water, juice, or clear broth. General instructions  If you have sleep apnea, surgery and certain medicines can increase your risk for breathing problems. Follow instructions from your health care provider about wearing your sleep device: ? Anytime you are sleeping, including during daytime naps. ? While taking prescription pain medicines, sleeping medicines, or medicines that make you drowsy.  Return to your normal activities as told by your health care provider. Ask your health care provider what activities are safe for you.  Take over-the-counter and prescription medicines only as told by your health care provider.  If you smoke, do not smoke without supervision.  Keep all follow-up visits as told by your health care provider. This is important. Contact a health care provider if:  You have nausea or vomiting that does not get better with medicine.  You cannot eat or drink without vomiting.  You have pain that does not get better with medicine.  You are unable to pass urine.  You develop a skin rash.  You have a fever.  You have redness around your IV site that gets worse. Get help right away if:  You have difficulty breathing.  You have chest pain.  You have blood in your urine or stool, or you vomit blood. Summary  After the procedure, it is common to have a sore throat or nausea. It is also common to feel tired.  Have a responsible adult stay with you for the first 24 hours after general anesthesia. It is important to have someone help care for you until you are awake and alert.  When you feel hungry,  start by eating small amounts of foods that are soft and easy to digest (bland), such as toast. Gradually return to your regular diet.  Drink enough fluid to keep your urine pale yellow.  Return to your normal activities as told by your health care provider. Ask your health care provider what activities are safe for you. This information is not intended to replace advice given to you by your health care provider. Make sure you discuss any questions you have with your health care provider. Document Released: 12/01/2000 Document Revised: 08/28/2017 Document Reviewed: 04/10/2017 Elsevier Patient Education  2020 [employer].

## 2019-07-07 NOTE — Progress Notes (Signed)
Assisted Dr. Royce Macadamia with left, ultrasound guided, interscalene  block. Side rails up, monitors on throughout procedure. See vital signs in flow sheet. Tolerated Procedure well. exparel given at 0850, 07/07/19, bracelet placed on rt arm.

## 2019-07-07 NOTE — Anesthesia Procedure Notes (Signed)
Procedure Name: Intubation Date/Time: 07/07/2019 10:00 AM Performed by: Victoriano Lain, CRNA Pre-anesthesia Checklist: Patient identified, Emergency Drugs available, Suction available, Patient being monitored and Timeout performed Patient Re-evaluated:Patient Re-evaluated prior to induction Oxygen Delivery Method: Circle system utilized Preoxygenation: Pre-oxygenation with 100% oxygen Induction Type: IV induction Ventilation: Mask ventilation without difficulty Laryngoscope Size: Mac and 4 Grade View: Grade I Tube type: Oral Tube size: 7.0 mm Number of attempts: 1 Airway Equipment and Method: Stylet Placement Confirmation: ETT inserted through vocal cords under direct vision,  positive ETCO2 and breath sounds checked- equal and bilateral Secured at: 21 cm Tube secured with: Tape Dental Injury: Teeth and Oropharynx as per pre-operative assessment

## 2019-07-07 NOTE — H&P (Signed)
Natalie Ware is an 53 y.o. female.   Chief Complaint: Left shoulder pain and dysfunction HPI: The patient has a long history of left shoulder pain and dysfunction which is failed extensive conservative management.  MRI shows greater than expected chondromalacia in the joint.  She is indicated for arthroscopic debridement to decrease pain and restore function and hopefully delay or avoid the need for shoulder replacement surgery.  Past Medical History:  Diagnosis Date  . Acute bronchitis   . Acute upper respiratory infection   . Allergic rhinitis   . Anxiety   . Body mass index 28.0-28.9, adult   . Chondrocostal junction syndrome   . Chondromalacia of left shoulder   . CN (constipation)   . Constipation   . Cough   . Defect, atrial septal   . Dehydration   . Depression   . Diarrhea   . DM (diabetes mellitus) ([place])   . Dorsalgia   . GERD (gastroesophageal reflux disease)   . Head ache   . Heart murmur   . Hemangioma of intra-abdominal structures   . Hematuria   . Hyperkalemia   . Hyperlipidemia   . Hypertension   . Impingement syndrome of left shoulder   . Insomnia, unspecified   . 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 Leiomyoma of uterus, unspecified   . Menopausal and female climacteric states   . Metatarsalgia of left foot   . Mixed hyperlipidemia   . Mixed hyperlipidemia   . Pain   . PFO (patent foramen ovale)    being followed by Dr Andrez Grime problems, no surgery  . Pleurodynia   . PONV (postoperative nausea and vomiting)   . Pruritus, unspecified   . RUQ pain   . Seizures ([place])    53 years old Blood sugar really low in [place]  . Shingles   . Stroke [facility])    ? TIA 15 years ago   . TIA (transient ischemic attack)   . Transient cerebrovascular ischemia   . Trigger thumb, unspecified thumb   . Umbilical hernia without obstruction or gangrene   . Unspecified abdominal hernia without obstruction or gangrene   . Unspecified injury of left  thigh, initial encounter   . Unspecified ovarian cyst, right side   . Zoster without complications     Past Surgical History:  Procedure Laterality Date  . cervicle fusion    . CHOLECYSTECTOMY N/A 05/24/2018   Procedure: LAPAROSCOPIC CHOLECYSTECTOMY;  Surgeon: Clovis Riley, MD;  [facility];  Service: General;  Laterality: N/A;  . FOREIGN BODY REMOVAL     right index finger  . HERNIA REPAIR     umbilical 15 [id number] ago umbilical repair Dr. Kae Heller 01-26-18  . INSERTION OF MESH N/A 01/26/2018   Procedure: INSERTION OF MESH;  Surgeon: Clovis Riley, MD;  [facility];  Service: General;  Laterality: N/A;  . NECK SURGERY  2017  . TRIGGER FINGER RELEASE Right   . TRIGGER FINGER RELEASE Left 11/02/2017   Procedure: LEFT THUMB TRIGGER DIGIT RELEASE;  Surgeon: Milly Jakob, MD;  Location: [facility];  Service: Orthopedics;  Laterality: Left;  . UMBILICAL HERNIA REPAIR N/A 01/26/2018   Procedure: UMBILICAL HERNIA REPAIR;  Surgeon: Clovis Riley, MD;  [facility];  Service: General;  Laterality: N/A;    Family History  Problem Relation Age of Onset  . Colon cancer Maternal Grandfather   . Cancer Maternal Grandfather  colon  . Diabetes Mother   . Ovarian cancer Paternal Aunt   . Cancer Paternal Aunt        ovarian   Social History:  reports that she quit smoking about 10 years ago. She has never used smokeless tobacco. She reports current alcohol use. She reports that she does not use drugs.  Allergies:  Allergies  Allergen Reactions  . Latex Anaphylaxis and Rash  . Lubiprostone Other (See Comments)    Built up water in RIght eye and could not see  . Metoclopramide Hcl Other (See Comments)    Causes her to lactate.  . Insulin Glargine Hives and Rash    Lantus    Medications Prior to Admission  Medication Sig Dispense Refill  . ALPRAZolam (XANAX) 0.5 MG tablet Take 0.5 mg by mouth 2 (two) times daily as needed for anxiety.   0  .  aspirin 81 MG EC tablet Take 81 mg by mouth daily.     Marland Kitchen escitalopram (LEXAPRO) 20 MG tablet Take 20 mg by mouth daily.  0  . Insulin Human (INSULIN PUMP) SOLN Inject into the skin. Novolog    . losartan (COZAAR) 100 MG tablet Take 100 mg by mouth daily.    . montelukast (SINGULAIR) 10 MG tablet Take 10 mg by mouth at bedtime as needed (allergies).     . NOVOLOG 100 UNIT/ML injection Inject 25-35 Units into the skin continuous. Inject into skin as directed VIA Pump  1  . acetaminophen (TYLENOL) 325 MG tablet Take 2 tablets (650 mg total) by mouth every 6 (six) hours as needed. (Patient not taking: Reported on 06/29/2019)    . Continuous Blood Gluc Transmit (DEXCOM G6 TRANSMITTER) MISC 1 each by Does not apply route daily.    Marland Kitchen ibuprofen (ADVIL) 200 MG tablet Take 3 tablets (600 mg total) by mouth every 6 (six) hours. (Patient not taking: Reported on 06/29/2019)  0  . meloxicam (MOBIC) 7.5 MG tablet Take 1 tablet (7.5 mg total) by mouth daily. 15 tablet 0    Results for orders placed or performed during the hospital encounter of 07/07/19 (from the past 48 hour(s))  Glucose, capillary     Status: Abnormal   Collection Time: 07/07/19  7:44 AM  Result Value Ref Range   Glucose-Capillary 231 (H) 70 - 99 mg/dL   Comment 1 Notify RN    Comment 2 Document in Chart    No results found.  Review of Systems  All other systems reviewed and are negative.   Blood pressure 117/79, pulse 75, temperature 98.2 F (36.8 C), temperature source Oral, resp. rate 15, height 5\' 2"  (1.575 m), weight 73.3 kg, last menstrual period 11/06/2017, SpO2 100 %. Physical Exam  Constitutional: She is oriented to person, place, and time. She appears well-developed and well-nourished.  HENT:  Head: Atraumatic.  Eyes: EOM are normal.  Cardiovascular: Intact distal pulses.  Respiratory: Effort normal.  Musculoskeletal:     Comments: Left shoulder pain with range of motion.  Distally neurovascularly intact.   Neurological: She is alert and oriented to person, place, and time.  Skin: Skin is warm and dry.  Psychiatric: She has a normal mood and affect.     Assessment/Plan The patient has a long history of left shoulder pain and dysfunction which is failed extensive conservative management.  MRI shows greater than expected chondromalacia in the joint.  She is indicated for arthroscopic debridement to decrease pain and restore function and hopefully delay or avoid the  need for shoulder replacement surgery. Plan left shoulder arthroscopic debridement with diagnostic arthroscopy. Risks / benefits of surgery discussed Consent on chart  NPO for OR Preop antibiotics   Isabella Stalling, MD 07/07/2019, 9:02 AM

## 2019-07-07 NOTE — Anesthesia Procedure Notes (Signed)
Anesthesia Regional Block: Interscalene brachial plexus block   Pre-Anesthetic Checklist: ,, timeout performed, Correct Patient, Correct Site, Correct Laterality, Correct Procedure, Correct Position, site marked, Risks and benefits discussed,  Surgical consent,  Pre-op evaluation,  At surgeon's request and post-op pain management  Laterality: Left  Prep: chloraprep       Needles:  Injection technique: Single-shot  Needle Type: Echogenic Stimulator Needle     Needle Length: 9cm  Needle Gauge: 21   Needle insertion depth: 6 cm   Additional Needles:   Procedures:,,,, ultrasound used (permanent image in chart),,,,  Narrative:  Start time: 07/07/2019 8:43 AM End time: 07/07/2019 8:48 AM Injection made incrementally with aspirations every 5 mL.  Performed by: Personally  Anesthesiologist: Josephine Igo, MD  Additional Notes: Timeout performed. Patient sedated. Relevant anatomy ID'd using [place]. Incremental 2-62ml injection of LA with frequent aspiration. Patient tolerated procedure well.        Left Interscalene Block

## 2019-07-07 NOTE — Transfer of Care (Signed)
Immediate Anesthesia Transfer of Care Note  Patient: Natalie Ware  Procedure(s) Performed: ARTHROSCOPY SHOULDER DEBRIDEMENT CHONDROMALACIA (Left Shoulder)  Patient Location: PACU  Anesthesia Type:General  Level of Consciousness: awake, alert , oriented and patient cooperative  Airway & Oxygen Therapy: Patient Spontanous Breathing and Patient connected to face mask oxygen  Post-op Assessment: Report given to RN, Post -op Vital signs reviewed and stable and Patient moving all extremities  Post vital signs: Reviewed and stable  Last Vitals:  Vitals Value Taken Time  BP    Temp    Pulse 82 07/07/19 1050  Resp 20 07/07/19 1050  SpO2 99 % 07/07/19 1050  Vitals shown include unvalidated device data.  Last Pain:  Vitals:   07/07/19 0855  TempSrc:   PainSc: 0-No pain      Patients Stated Pain Goal: 3 ([id number])  Complications: No apparent anesthesia complications

## 2019-07-07 NOTE — Anesthesia Postprocedure Evaluation (Signed)
Anesthesia Post Note  Patient: Natalie Ware  Procedure(s) Performed: ARTHROSCOPY SHOULDER DEBRIDEMENT CHONDROMALACIA (Left Shoulder)     Patient location during evaluation: PACU Anesthesia Type: General Level of consciousness: awake and alert and oriented Pain management: pain level controlled Vital Signs Assessment: post-procedure vital signs reviewed and stable Respiratory status: spontaneous breathing, nonlabored ventilation and respiratory function stable Cardiovascular status: blood pressure returned to baseline and stable Postop Assessment: no apparent nausea or vomiting Anesthetic complications: no    Last Vitals:  Vitals:   07/07/19 1100 07/07/19 1115  BP: 138/83 127/83  Pulse: 77 72  Resp: 12 16  Temp:    SpO2: 96% 96%    Last Pain:  Vitals:   07/07/19 1115  TempSrc:   PainSc: 0-No pain                 Wyoma Genson A.

## 2019-07-08 ENCOUNTER — Encounter (HOSPITAL_COMMUNITY): Payer: Self-pay | Admitting: Orthopedic Surgery

## 2019-07-11 ENCOUNTER — Telehealth (HOSPITAL_COMMUNITY): Payer: Self-pay

## 2019-07-11 NOTE — Telephone Encounter (Signed)
New message    Just an FYI. We have made several attempts to contact this patient including sending a letter to schedule or reschedule their echocardiogram. We will be removing the patient from the echo WQ.   10.30.20 @ 1:11pm call home phone just rings  - Natalie Ware   10.22.20 @ 11:15am call home phone just rings - mail reminder letter [name]  10.20.20 @ 11:41am call home phone just rings - call cell phone - unable to leave a message - [name]

## 2019-07-13 ENCOUNTER — Encounter (HOSPITAL_COMMUNITY): Payer: Self-pay | Admitting: Cardiovascular Disease

## 2019-07-18 DIAGNOSIS — M94212 Chondromalacia, left shoulder: Secondary | ICD-10-CM | POA: Diagnosis not present

## 2019-07-18 DIAGNOSIS — M25612 Stiffness of left shoulder, not elsewhere classified: Secondary | ICD-10-CM | POA: Diagnosis not present

## 2019-07-22 DIAGNOSIS — E782 Mixed hyperlipidemia: Secondary | ICD-10-CM | POA: Diagnosis not present

## 2019-07-22 DIAGNOSIS — Z0000 Encounter for general adult medical examination without abnormal findings: Secondary | ICD-10-CM | POA: Diagnosis not present

## 2019-07-22 DIAGNOSIS — E109 Type 1 diabetes mellitus without complications: Secondary | ICD-10-CM | POA: Diagnosis not present

## 2019-07-22 DIAGNOSIS — Z1159 Encounter for screening for other viral diseases: Secondary | ICD-10-CM | POA: Diagnosis not present

## 2019-07-26 ENCOUNTER — Other Ambulatory Visit: Payer: Self-pay

## 2019-07-26 ENCOUNTER — Ambulatory Visit (INDEPENDENT_AMBULATORY_CARE_PROVIDER_SITE_OTHER)
Admission: RE | Admit: 2019-07-26 | Discharge: 2019-07-26 | Disposition: A | Discharge status: Home / Self Care | Payer: Self-pay | Source: Ambulatory Visit | Attending: Cardiovascular Disease | Admitting: Cardiovascular Disease

## 2019-07-26 DIAGNOSIS — Z0000 Encounter for general adult medical examination without abnormal findings: Secondary | ICD-10-CM | POA: Diagnosis not present

## 2019-07-26 DIAGNOSIS — I10 Essential (primary) hypertension: Secondary | ICD-10-CM

## 2019-07-26 DIAGNOSIS — Q2112 Patent foramen ovale: Secondary | ICD-10-CM

## 2019-07-26 DIAGNOSIS — Z01818 Encounter for other preprocedural examination: Secondary | ICD-10-CM

## 2019-07-26 DIAGNOSIS — Z6828 Body mass index (BMI) 28.0-28.9, adult: Secondary | ICD-10-CM | POA: Diagnosis not present

## 2019-07-26 DIAGNOSIS — Q211 Atrial septal defect: Secondary | ICD-10-CM

## 2019-07-27 DIAGNOSIS — M94212 Chondromalacia, left shoulder: Secondary | ICD-10-CM | POA: Diagnosis not present

## 2019-07-27 DIAGNOSIS — M25612 Stiffness of left shoulder, not elsewhere classified: Secondary | ICD-10-CM | POA: Diagnosis not present

## 2019-08-02 ENCOUNTER — Other Ambulatory Visit: Payer: Self-pay

## 2019-08-02 DIAGNOSIS — Z1231 Encounter for screening mammogram for malignant neoplasm of breast: Secondary | ICD-10-CM

## 2019-08-18 ENCOUNTER — Ambulatory Visit: Payer: BC Managed Care – PPO | Admitting: Cardiovascular Disease

## 2019-08-19 DIAGNOSIS — K648 Other hemorrhoids: Secondary | ICD-10-CM | POA: Diagnosis not present

## 2019-08-19 DIAGNOSIS — F411 Generalized anxiety disorder: Secondary | ICD-10-CM | POA: Diagnosis not present

## 2019-08-19 DIAGNOSIS — F331 Major depressive disorder, recurrent, moderate: Secondary | ICD-10-CM | POA: Diagnosis not present

## 2019-08-19 DIAGNOSIS — Z6826 Body mass index (BMI) 26.0-26.9, adult: Secondary | ICD-10-CM | POA: Diagnosis not present

## 2019-09-12 DIAGNOSIS — R079 Chest pain, unspecified: Secondary | ICD-10-CM | POA: Diagnosis not present

## 2019-09-12 DIAGNOSIS — M542 Cervicalgia: Secondary | ICD-10-CM | POA: Diagnosis not present

## 2019-09-12 DIAGNOSIS — M549 Dorsalgia, unspecified: Secondary | ICD-10-CM | POA: Diagnosis not present

## 2019-09-12 DIAGNOSIS — S29012A Strain of muscle and tendon of back wall of thorax, initial encounter: Secondary | ICD-10-CM | POA: Diagnosis not present

## 2019-10-05 NOTE — Progress Notes (Deleted)
CARDIOLOGY CONSULT NOTE       Patient ID: Natalie Ware MRN: [id number] DOB/AGE: Jul 16, 1966 54 y.o.  Admit date: (Not on file) Referring Physician: [name]  Primary Physician: [name], MD Primary Cardiologist: New Reason for Consultation: Pre operative Clearance   Active Problems:   * No active hospital problems. *   HPI:  54 y.o. referred by Dr Tamera Punt for pre operative clearance 06/24/19 . She needs left shoulder arthroscopic debridement for chondromalacia. Last seen by cardiology in 2017 for PFO. Had ? TIA with slurred speech and confusion PFO documented by bubble study and TEE. Only initial episode and patient noted allergy to nickel and deferred any consideration for closure device She has long standing DM with insulin pump and former smoker She has had a normal ETT 01/22/16 and normal carotid duplex at same time Of note she also had an uncomplicated lap choly on 05/24/18 by Dr Romana Juniper.   Works at [employer] lots of stress taking care of her mom and grand mother  Still with some vaping/smoking  A1c usually  7 range   No chest pain dyspnea or palpitations No bleeding issues No previous issues with anesthesia She had uncomplicated left shoulder debridement Dr Rosine Door 07/07/19   Calcium Score was 0 07/26/19    ***  ROS All other systems reviewed and negative except as noted above  Past Medical History:  Diagnosis Date  . Acute bronchitis   . Acute upper respiratory infection   . Allergic rhinitis   . Anxiety   . Body mass index 28.0-28.9, adult   . Chondrocostal junction syndrome   . Chondromalacia of left shoulder   . CN (constipation)   . Constipation   . Cough   . Defect, atrial septal   . Dehydration   . Depression   . Diarrhea   . DM (diabetes mellitus) ([place])   . Dorsalgia   . GERD (gastroesophageal reflux disease)   . Head ache   . Heart murmur   . Hemangioma of intra-abdominal structures   . Hematuria   . Hyperkalemia   .  Hyperlipidemia   . Hypertension   . Impingement syndrome of left shoulder   . Insomnia, unspecified   . 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 Leiomyoma of uterus, unspecified   . Menopausal and female climacteric states   . Metatarsalgia of left foot   . Mixed hyperlipidemia   . Mixed hyperlipidemia   . Pain   . PFO (patent foramen ovale)    being followed by Dr Andrez Grime problems, no surgery  . Pleurodynia   . PONV (postoperative nausea and vomiting)   . Pruritus, unspecified   . RUQ pain   . Seizures ([place])    54 years old Blood sugar really low in [place]  . Shingles   . Stroke [facility])    ? TIA 15 years ago   . TIA (transient ischemic attack)   . Transient cerebrovascular ischemia   . Trigger thumb, unspecified thumb   . Umbilical hernia without obstruction or gangrene   . Unspecified abdominal hernia without obstruction or gangrene   . Unspecified injury of left thigh, initial encounter   . Unspecified ovarian cyst, right side   . Zoster without complications     Family History  Problem Relation Age of Onset  . Colon cancer Maternal Grandfather   . Cancer Maternal Grandfather        colon  . Diabetes  Mother   . Ovarian cancer Paternal Aunt   . Cancer Paternal Aunt        ovarian    Social History   Socioeconomic History  . Marital status: Single    Spouse name: Not on file  . Number of children: 0  . Years of education: Not on file  . Highest education level: Not on file  Occupational History  . Occupation: Recruitment consultant  Tobacco Use  . Smoking status: Former Smoker    Quit date: 09/08/2008    Years since quitting: 11.0  . Smokeless tobacco: Never Used  Substance and Sexual Activity  . Alcohol use: Yes    Comment: social  . Drug use: No  . Sexual activity: Not Currently  Other Topics Concern  . Not on file  Social History Narrative   2-3 cups caffeine daily   Social Determinants of Health   Financial Resource Strain:   .  Difficulty of Paying Living Expenses: Not on file  Food Insecurity:   . Worried About Charity fundraiser in the Last Year: Not on file  . Ran Out of Food in the Last Year: Not on file  Transportation Needs:   . Lack of Transportation (Medical): Not on file  . Lack of Transportation (Non-Medical): Not on file  Physical Activity:   . Days of Exercise per Week: Not on file  . Minutes of Exercise per Session: Not on file  Stress:   . Feeling of Stress : Not on file  Social Connections:   . Frequency of Communication with Friends and Family: Not on file  . Frequency of Social Gatherings with Friends and Family: Not on file  . Attends Religious Services: Not on file  . Active Member of Clubs or Organizations: Not on file  . Attends Archivist Meetings: Not on file  . Marital Status: Not on file  Intimate Partner Violence:   . Fear of Current or Ex-Partner: Not on file  . Emotionally Abused: Not on file  . Physically Abused: Not on file  . Sexually Abused: Not on file    Past Surgical History:  Procedure Laterality Date  . cervicle fusion    . CHOLECYSTECTOMY N/A 05/24/2018   Procedure: LAPAROSCOPIC CHOLECYSTECTOMY;  Surgeon: Clovis Riley, MD;  [facility];  Service: General;  Laterality: N/A;  . FOREIGN BODY REMOVAL     right index finger  . HERNIA REPAIR     umbilical 15 [id number] ago umbilical repair Dr. Kae Heller 01-26-18  . INSERTION OF MESH N/A 01/26/2018   Procedure: INSERTION OF MESH;  Surgeon: Clovis Riley, MD;  [facility];  Service: General;  Laterality: N/A;  . NECK SURGERY  2017  . SHOULDER ARTHROSCOPY Left 07/07/2019   Procedure: ARTHROSCOPY SHOULDER DEBRIDEMENT CHONDROMALACIA;  Surgeon: [name], MD;  [facility];  Service: Orthopedics;  Laterality: Left;  . TRIGGER FINGER RELEASE Right   . TRIGGER FINGER RELEASE Left 11/02/2017   Procedure: LEFT THUMB TRIGGER DIGIT RELEASE;  Surgeon: Milly Jakob, MD;  Location: [facility];  Service: Orthopedics;  Laterality: Left;  . UMBILICAL HERNIA REPAIR N/A 01/26/2018   Procedure: UMBILICAL HERNIA REPAIR;  Surgeon: Clovis Riley, MD;  [facility];  Service: General;  Laterality: N/A;        Physical Exam: Last menstrual period 11/06/2017.    Affect appropriate Healthy:  appears stated age 89: normal Neck supple with no adenopathy JVP normal no bruits no thyromegaly  Lungs clear with no wheezing and good diaphragmatic motion Heart:  S1/S2 no murmur, no rub, gallop or click PMI normal Abdomen: benighn, BS positve, no tenderness, no AAA post lap choly no bruit.  No HSM or HJR Distal pulses intact with no bruits No edema Neuro non-focal Skin warm and dry No muscular weakness   Labs:   Lab Results  Component Value Date   WBC 8.8 07/05/2019   HGB 13.6 07/05/2019   HCT 42.2 07/05/2019   MCV 94.2 07/05/2019   PLT 292 07/05/2019   No results for input(s): NA, K, CL, CO2, BUN, CREATININE, CALCIUM, PROT, BILITOT, ALKPHOS, ALT, AST, GLUCOSE in the last 168 hours.  Invalid input(s): LABALBU No results found for: CKTOTAL, CKMB, CKMBINDEX, TROPONINI  Lab Results  Component Value Date   CHOL 155 09/08/2011   CHOL  07/26/2008    128        ATP III CLASSIFICATION:  <200     mg/dL   Desirable  200-239  mg/dL   Borderline High  >=240    mg/dL   High   Lab Results  Component Value Date   HDL 68.90 09/08/2011   HDL 34 (L) 07/26/2008   Lab Results  Component Value Date   LDLCALC 63 09/08/2011   [place]  07/26/2008    77        Total Cholesterol/HDL:CHD Risk Coronary Heart Disease Risk Table                     Men   Women  1/2 Average Risk   3.4   3.3   Lab Results  Component Value Date   TRIG 115.0 09/08/2011   TRIG 84 07/26/2008   Lab Results  Component Value Date   CHOLHDL 2 09/08/2011   CHOLHDL 3.8 07/26/2008   No results found for: LDLDIRECT    Radiology: No results found.  EKG  05/24/18 SR limb lead reversal  normal otherwise 06/24/19 SR rate 93 normal    ASSESSMENT AND PLAN:   1. Ortho:  Post left shoulder surgery October 2020 improved f/u Dr Tamera Punt  2. PFO:  No recurrent neurologic events observe 3. DM: Discussed low carb diet.  Target hemoglobin A1c is 6.5 or less.  Continue current medications.  4. HTN:  On ARB stable low sodium diet  5. Anxiety/Depression:  Continue lexapro f/u primary  6. CAD:  Risk calcium score 0 07/26/19 no need for stress testing at this time   ***  Signed: Jenkins Rouge 10/05/2019, 1:26 PM

## 2019-10-12 ENCOUNTER — Telehealth: Payer: BC Managed Care – PPO | Admitting: Cardiovascular Disease

## 2019-10-31 DIAGNOSIS — E109 Type 1 diabetes mellitus without complications: Secondary | ICD-10-CM | POA: Diagnosis not present

## 2019-10-31 DIAGNOSIS — F411 Generalized anxiety disorder: Secondary | ICD-10-CM | POA: Diagnosis not present

## 2019-10-31 DIAGNOSIS — I10 Essential (primary) hypertension: Secondary | ICD-10-CM | POA: Diagnosis not present

## 2019-10-31 DIAGNOSIS — F331 Major depressive disorder, recurrent, moderate: Secondary | ICD-10-CM | POA: Diagnosis not present

## 2019-11-11 DIAGNOSIS — E1065 Type 1 diabetes mellitus with hyperglycemia: Secondary | ICD-10-CM | POA: Diagnosis not present

## 2019-11-11 DIAGNOSIS — E109 Type 1 diabetes mellitus without complications: Secondary | ICD-10-CM | POA: Diagnosis not present

## 2019-11-11 DIAGNOSIS — Z794 Long term (current) use of insulin: Secondary | ICD-10-CM | POA: Diagnosis not present

## 2019-12-12 DIAGNOSIS — R21 Rash and other nonspecific skin eruption: Secondary | ICD-10-CM | POA: Diagnosis not present

## 2019-12-12 DIAGNOSIS — E109 Type 1 diabetes mellitus without complications: Secondary | ICD-10-CM | POA: Diagnosis not present

## 2019-12-12 DIAGNOSIS — E1065 Type 1 diabetes mellitus with hyperglycemia: Secondary | ICD-10-CM | POA: Diagnosis not present

## 2019-12-12 DIAGNOSIS — Z794 Long term (current) use of insulin: Secondary | ICD-10-CM | POA: Diagnosis not present

## 2019-12-17 DIAGNOSIS — R21 Rash and other nonspecific skin eruption: Secondary | ICD-10-CM | POA: Diagnosis not present

## 2019-12-19 DIAGNOSIS — L239 Allergic contact dermatitis, unspecified cause: Secondary | ICD-10-CM | POA: Diagnosis not present

## 2019-12-20 DIAGNOSIS — L299 Pruritus, unspecified: Secondary | ICD-10-CM | POA: Diagnosis not present

## 2019-12-20 DIAGNOSIS — R21 Rash and other nonspecific skin eruption: Secondary | ICD-10-CM | POA: Diagnosis not present

## 2020-01-05 DIAGNOSIS — L299 Pruritus, unspecified: Secondary | ICD-10-CM | POA: Diagnosis not present

## 2020-01-05 DIAGNOSIS — L501 Idiopathic urticaria: Secondary | ICD-10-CM | POA: Diagnosis not present

## 2020-01-05 DIAGNOSIS — L506 Contact urticaria: Secondary | ICD-10-CM | POA: Diagnosis not present

## 2020-01-11 DIAGNOSIS — E1065 Type 1 diabetes mellitus with hyperglycemia: Secondary | ICD-10-CM | POA: Diagnosis not present

## 2020-01-11 DIAGNOSIS — Z794 Long term (current) use of insulin: Secondary | ICD-10-CM | POA: Diagnosis not present

## 2020-01-11 DIAGNOSIS — E109 Type 1 diabetes mellitus without complications: Secondary | ICD-10-CM | POA: Diagnosis not present

## 2020-05-29 DIAGNOSIS — Z20822 Contact with and (suspected) exposure to covid-19: Secondary | ICD-10-CM | POA: Diagnosis not present

## 2020-06-19 DIAGNOSIS — E109 Type 1 diabetes mellitus without complications: Secondary | ICD-10-CM | POA: Diagnosis not present

## 2020-06-19 DIAGNOSIS — Z794 Long term (current) use of insulin: Secondary | ICD-10-CM | POA: Diagnosis not present

## 2020-06-19 DIAGNOSIS — E1065 Type 1 diabetes mellitus with hyperglycemia: Secondary | ICD-10-CM | POA: Diagnosis not present

## 2020-10-12 DIAGNOSIS — E109 Type 1 diabetes mellitus without complications: Secondary | ICD-10-CM | POA: Diagnosis not present

## 2020-10-12 DIAGNOSIS — Z20822 Contact with and (suspected) exposure to covid-19: Secondary | ICD-10-CM | POA: Diagnosis not present

## 2020-10-12 DIAGNOSIS — R42 Dizziness and giddiness: Secondary | ICD-10-CM | POA: Diagnosis not present

## 2020-10-12 DIAGNOSIS — R112 Nausea with vomiting, unspecified: Secondary | ICD-10-CM | POA: Diagnosis not present

## 2020-10-12 DIAGNOSIS — Z79899 Other long term (current) drug therapy: Secondary | ICD-10-CM | POA: Diagnosis not present

## 2020-10-12 DIAGNOSIS — Z794 Long term (current) use of insulin: Secondary | ICD-10-CM | POA: Diagnosis not present

## 2020-10-12 DIAGNOSIS — R9431 Abnormal electrocardiogram [ECG] [EKG]: Secondary | ICD-10-CM | POA: Diagnosis not present

## 2020-10-12 DIAGNOSIS — R55 Syncope and collapse: Secondary | ICD-10-CM | POA: Diagnosis not present

## 2020-10-12 DIAGNOSIS — I10 Essential (primary) hypertension: Secondary | ICD-10-CM | POA: Diagnosis not present

## 2020-10-12 DIAGNOSIS — R197 Diarrhea, unspecified: Secondary | ICD-10-CM | POA: Diagnosis not present

## 2020-11-06 DIAGNOSIS — E109 Type 1 diabetes mellitus without complications: Secondary | ICD-10-CM | POA: Diagnosis not present

## 2020-11-06 DIAGNOSIS — Z794 Long term (current) use of insulin: Secondary | ICD-10-CM | POA: Diagnosis not present

## 2020-11-06 DIAGNOSIS — E1065 Type 1 diabetes mellitus with hyperglycemia: Secondary | ICD-10-CM | POA: Diagnosis not present

## 2020-12-27 DIAGNOSIS — Z0000 Encounter for general adult medical examination without abnormal findings: Secondary | ICD-10-CM | POA: Diagnosis not present

## 2020-12-28 DIAGNOSIS — H2513 Age-related nuclear cataract, bilateral: Secondary | ICD-10-CM | POA: Diagnosis not present

## 2021-01-22 DIAGNOSIS — R634 Abnormal weight loss: Secondary | ICD-10-CM | POA: Diagnosis not present

## 2021-01-22 DIAGNOSIS — K529 Noninfective gastroenteritis and colitis, unspecified: Secondary | ICD-10-CM | POA: Diagnosis not present

## 2021-01-22 DIAGNOSIS — H2513 Age-related nuclear cataract, bilateral: Secondary | ICD-10-CM | POA: Diagnosis not present

## 2021-01-29 DIAGNOSIS — R634 Abnormal weight loss: Secondary | ICD-10-CM | POA: Diagnosis not present

## 2021-01-29 DIAGNOSIS — K529 Noninfective gastroenteritis and colitis, unspecified: Secondary | ICD-10-CM | POA: Diagnosis not present

## 2021-02-22 DIAGNOSIS — K529 Noninfective gastroenteritis and colitis, unspecified: Secondary | ICD-10-CM | POA: Diagnosis not present

## 2021-02-22 DIAGNOSIS — R634 Abnormal weight loss: Secondary | ICD-10-CM | POA: Diagnosis not present

## 2021-03-05 DIAGNOSIS — K529 Noninfective gastroenteritis and colitis, unspecified: Secondary | ICD-10-CM | POA: Diagnosis not present

## 2021-03-19 DIAGNOSIS — H2511 Age-related nuclear cataract, right eye: Secondary | ICD-10-CM | POA: Diagnosis not present

## 2021-04-01 DIAGNOSIS — H43811 Vitreous degeneration, right eye: Secondary | ICD-10-CM | POA: Diagnosis not present

## 2021-04-01 DIAGNOSIS — E113513 Type 2 diabetes mellitus with proliferative diabetic retinopathy with macular edema, bilateral: Secondary | ICD-10-CM | POA: Diagnosis not present

## 2021-04-01 DIAGNOSIS — H35373 Puckering of macula, bilateral: Secondary | ICD-10-CM | POA: Diagnosis not present

## 2021-04-01 DIAGNOSIS — H04123 Dry eye syndrome of bilateral lacrimal glands: Secondary | ICD-10-CM | POA: Diagnosis not present

## 2021-05-16 DIAGNOSIS — Z794 Long term (current) use of insulin: Secondary | ICD-10-CM | POA: Diagnosis not present

## 2021-05-16 DIAGNOSIS — E1065 Type 1 diabetes mellitus with hyperglycemia: Secondary | ICD-10-CM | POA: Diagnosis not present

## 2021-05-16 DIAGNOSIS — E109 Type 1 diabetes mellitus without complications: Secondary | ICD-10-CM | POA: Diagnosis not present

## 2021-07-29 DIAGNOSIS — H26491 Other secondary cataract, right eye: Secondary | ICD-10-CM | POA: Diagnosis not present

## 2021-08-12 DIAGNOSIS — E1065 Type 1 diabetes mellitus with hyperglycemia: Secondary | ICD-10-CM | POA: Diagnosis not present

## 2021-08-12 DIAGNOSIS — E109 Type 1 diabetes mellitus without complications: Secondary | ICD-10-CM | POA: Diagnosis not present

## 2021-08-12 DIAGNOSIS — Z794 Long term (current) use of insulin: Secondary | ICD-10-CM | POA: Diagnosis not present

## 2021-08-21 DIAGNOSIS — H26491 Other secondary cataract, right eye: Secondary | ICD-10-CM | POA: Diagnosis not present

## 2021-10-14 DIAGNOSIS — M65331 Trigger finger, right middle finger: Secondary | ICD-10-CM | POA: Diagnosis not present

## 2021-12-10 DIAGNOSIS — H2512 Age-related nuclear cataract, left eye: Secondary | ICD-10-CM | POA: Diagnosis not present

## 2021-12-26 DIAGNOSIS — H35373 Puckering of macula, bilateral: Secondary | ICD-10-CM | POA: Diagnosis not present

## 2021-12-26 DIAGNOSIS — H43811 Vitreous degeneration, right eye: Secondary | ICD-10-CM | POA: Diagnosis not present

## 2021-12-26 DIAGNOSIS — H04129 Dry eye syndrome of unspecified lacrimal gland: Secondary | ICD-10-CM | POA: Diagnosis not present

## 2021-12-26 DIAGNOSIS — E113513 Type 2 diabetes mellitus with proliferative diabetic retinopathy with macular edema, bilateral: Secondary | ICD-10-CM | POA: Diagnosis not present

## 2022-01-07 DIAGNOSIS — E113513 Type 2 diabetes mellitus with proliferative diabetic retinopathy with macular edema, bilateral: Secondary | ICD-10-CM | POA: Diagnosis not present

## 2022-01-07 DIAGNOSIS — H43811 Vitreous degeneration, right eye: Secondary | ICD-10-CM | POA: Diagnosis not present

## 2022-01-07 DIAGNOSIS — E113511 Type 2 diabetes mellitus with proliferative diabetic retinopathy with macular edema, right eye: Secondary | ICD-10-CM | POA: Diagnosis not present

## 2022-01-07 DIAGNOSIS — H35373 Puckering of macula, bilateral: Secondary | ICD-10-CM | POA: Diagnosis not present

## 2022-01-07 DIAGNOSIS — H4311 Vitreous hemorrhage, right eye: Secondary | ICD-10-CM | POA: Diagnosis not present

## 2022-02-04 DIAGNOSIS — H04129 Dry eye syndrome of unspecified lacrimal gland: Secondary | ICD-10-CM | POA: Diagnosis not present

## 2022-02-04 DIAGNOSIS — H43811 Vitreous degeneration, right eye: Secondary | ICD-10-CM | POA: Diagnosis not present

## 2022-02-04 DIAGNOSIS — E113513 Type 2 diabetes mellitus with proliferative diabetic retinopathy with macular edema, bilateral: Secondary | ICD-10-CM | POA: Diagnosis not present

## 2022-02-04 DIAGNOSIS — E113511 Type 2 diabetes mellitus with proliferative diabetic retinopathy with macular edema, right eye: Secondary | ICD-10-CM | POA: Diagnosis not present

## 2022-02-04 DIAGNOSIS — H35373 Puckering of macula, bilateral: Secondary | ICD-10-CM | POA: Diagnosis not present

## 2022-03-03 DIAGNOSIS — K529 Noninfective gastroenteritis and colitis, unspecified: Secondary | ICD-10-CM | POA: Diagnosis not present

## 2022-03-04 DIAGNOSIS — E113513 Type 2 diabetes mellitus with proliferative diabetic retinopathy with macular edema, bilateral: Secondary | ICD-10-CM | POA: Diagnosis not present

## 2022-03-04 DIAGNOSIS — H35373 Puckering of macula, bilateral: Secondary | ICD-10-CM | POA: Diagnosis not present

## 2022-03-04 DIAGNOSIS — H4311 Vitreous hemorrhage, right eye: Secondary | ICD-10-CM | POA: Diagnosis not present

## 2022-03-04 DIAGNOSIS — E113511 Type 2 diabetes mellitus with proliferative diabetic retinopathy with macular edema, right eye: Secondary | ICD-10-CM | POA: Diagnosis not present

## 2022-03-04 DIAGNOSIS — H43811 Vitreous degeneration, right eye: Secondary | ICD-10-CM | POA: Diagnosis not present

## 2022-03-18 DIAGNOSIS — B9681 Helicobacter pylori [H. pylori] as the cause of diseases classified elsewhere: Secondary | ICD-10-CM | POA: Diagnosis not present

## 2022-03-18 DIAGNOSIS — K529 Noninfective gastroenteritis and colitis, unspecified: Secondary | ICD-10-CM | POA: Diagnosis not present

## 2022-03-18 DIAGNOSIS — K52832 Lymphocytic colitis: Secondary | ICD-10-CM | POA: Diagnosis not present

## 2022-03-18 DIAGNOSIS — K2950 Unspecified chronic gastritis without bleeding: Secondary | ICD-10-CM | POA: Diagnosis not present

## 2022-03-18 DIAGNOSIS — K3189 Other diseases of stomach and duodenum: Secondary | ICD-10-CM | POA: Diagnosis not present

## 2022-03-28 DIAGNOSIS — B9681 Helicobacter pylori [H. pylori] as the cause of diseases classified elsewhere: Secondary | ICD-10-CM | POA: Diagnosis not present

## 2022-03-28 DIAGNOSIS — K2970 Gastritis, unspecified, without bleeding: Secondary | ICD-10-CM | POA: Diagnosis not present

## 2022-03-28 DIAGNOSIS — K52832 Lymphocytic colitis: Secondary | ICD-10-CM | POA: Diagnosis not present

## 2022-04-01 DIAGNOSIS — E113513 Type 2 diabetes mellitus with proliferative diabetic retinopathy with macular edema, bilateral: Secondary | ICD-10-CM | POA: Diagnosis not present

## 2022-04-01 DIAGNOSIS — H4311 Vitreous hemorrhage, right eye: Secondary | ICD-10-CM | POA: Diagnosis not present

## 2022-04-01 DIAGNOSIS — H04129 Dry eye syndrome of unspecified lacrimal gland: Secondary | ICD-10-CM | POA: Diagnosis not present

## 2022-04-01 DIAGNOSIS — E113512 Type 2 diabetes mellitus with proliferative diabetic retinopathy with macular edema, left eye: Secondary | ICD-10-CM | POA: Diagnosis not present

## 2022-04-01 DIAGNOSIS — H35373 Puckering of macula, bilateral: Secondary | ICD-10-CM | POA: Diagnosis not present

## 2022-04-16 DIAGNOSIS — M25552 Pain in left hip: Secondary | ICD-10-CM | POA: Diagnosis not present

## 2022-04-17 DIAGNOSIS — E663 Overweight: Secondary | ICD-10-CM | POA: Diagnosis not present

## 2022-04-17 DIAGNOSIS — R1032 Left lower quadrant pain: Secondary | ICD-10-CM | POA: Diagnosis not present

## 2022-04-17 DIAGNOSIS — M7072 Other bursitis of hip, left hip: Secondary | ICD-10-CM | POA: Diagnosis not present

## 2022-04-17 DIAGNOSIS — Z6829 Body mass index (BMI) 29.0-29.9, adult: Secondary | ICD-10-CM | POA: Diagnosis not present

## 2022-04-18 DIAGNOSIS — R1032 Left lower quadrant pain: Secondary | ICD-10-CM | POA: Diagnosis not present

## 2022-05-01 DIAGNOSIS — H43811 Vitreous degeneration, right eye: Secondary | ICD-10-CM | POA: Diagnosis not present

## 2022-05-01 DIAGNOSIS — H35373 Puckering of macula, bilateral: Secondary | ICD-10-CM | POA: Diagnosis not present

## 2022-05-01 DIAGNOSIS — E103513 Type 1 diabetes mellitus with proliferative diabetic retinopathy with macular edema, bilateral: Secondary | ICD-10-CM | POA: Diagnosis not present

## 2022-05-01 DIAGNOSIS — H4311 Vitreous hemorrhage, right eye: Secondary | ICD-10-CM | POA: Diagnosis not present

## 2022-05-01 DIAGNOSIS — E103511 Type 1 diabetes mellitus with proliferative diabetic retinopathy with macular edema, right eye: Secondary | ICD-10-CM | POA: Diagnosis not present

## 2022-05-13 DIAGNOSIS — K5900 Constipation, unspecified: Secondary | ICD-10-CM | POA: Diagnosis not present

## 2022-05-13 DIAGNOSIS — R109 Unspecified abdominal pain: Secondary | ICD-10-CM | POA: Diagnosis not present

## 2022-05-13 DIAGNOSIS — E113219 Type 2 diabetes mellitus with mild nonproliferative diabetic retinopathy with macular edema, unspecified eye: Secondary | ICD-10-CM | POA: Diagnosis not present

## 2022-05-13 DIAGNOSIS — I10 Essential (primary) hypertension: Secondary | ICD-10-CM | POA: Diagnosis not present

## 2022-05-13 DIAGNOSIS — B9681 Helicobacter pylori [H. pylori] as the cause of diseases classified elsewhere: Secondary | ICD-10-CM | POA: Diagnosis not present

## 2022-05-13 DIAGNOSIS — R197 Diarrhea, unspecified: Secondary | ICD-10-CM | POA: Diagnosis not present

## 2022-05-13 DIAGNOSIS — K2970 Gastritis, unspecified, without bleeding: Secondary | ICD-10-CM | POA: Diagnosis not present

## 2022-05-13 DIAGNOSIS — Z0000 Encounter for general adult medical examination without abnormal findings: Secondary | ICD-10-CM | POA: Diagnosis not present

## 2022-05-16 DIAGNOSIS — H43811 Vitreous degeneration, right eye: Secondary | ICD-10-CM | POA: Diagnosis not present

## 2022-05-16 DIAGNOSIS — H4311 Vitreous hemorrhage, right eye: Secondary | ICD-10-CM | POA: Diagnosis not present

## 2022-05-16 DIAGNOSIS — E103513 Type 1 diabetes mellitus with proliferative diabetic retinopathy with macular edema, bilateral: Secondary | ICD-10-CM | POA: Diagnosis not present

## 2022-05-16 DIAGNOSIS — H35373 Puckering of macula, bilateral: Secondary | ICD-10-CM | POA: Diagnosis not present

## 2022-05-22 DIAGNOSIS — H35373 Puckering of macula, bilateral: Secondary | ICD-10-CM | POA: Diagnosis not present

## 2022-05-22 DIAGNOSIS — H4311 Vitreous hemorrhage, right eye: Secondary | ICD-10-CM | POA: Diagnosis not present

## 2022-05-22 DIAGNOSIS — S0501XA Injury of conjunctiva and corneal abrasion without foreign body, right eye, initial encounter: Secondary | ICD-10-CM | POA: Diagnosis not present

## 2022-05-22 DIAGNOSIS — E103513 Type 1 diabetes mellitus with proliferative diabetic retinopathy with macular edema, bilateral: Secondary | ICD-10-CM | POA: Diagnosis not present

## 2022-06-10 DIAGNOSIS — H43811 Vitreous degeneration, right eye: Secondary | ICD-10-CM | POA: Diagnosis not present

## 2022-06-10 DIAGNOSIS — M79641 Pain in right hand: Secondary | ICD-10-CM | POA: Diagnosis not present

## 2022-06-10 DIAGNOSIS — H4311 Vitreous hemorrhage, right eye: Secondary | ICD-10-CM | POA: Diagnosis not present

## 2022-06-10 DIAGNOSIS — E103512 Type 1 diabetes mellitus with proliferative diabetic retinopathy with macular edema, left eye: Secondary | ICD-10-CM | POA: Diagnosis not present

## 2022-06-10 DIAGNOSIS — E103513 Type 1 diabetes mellitus with proliferative diabetic retinopathy with macular edema, bilateral: Secondary | ICD-10-CM | POA: Diagnosis not present

## 2022-06-10 DIAGNOSIS — H35373 Puckering of macula, bilateral: Secondary | ICD-10-CM | POA: Diagnosis not present

## 2022-07-18 DIAGNOSIS — M65331 Trigger finger, right middle finger: Secondary | ICD-10-CM | POA: Diagnosis not present

## 2022-07-24 DIAGNOSIS — M79641 Pain in right hand: Secondary | ICD-10-CM | POA: Diagnosis not present

## 2022-07-28 ENCOUNTER — Telehealth: Payer: Self-pay

## 2022-07-28 NOTE — Patient Outreach (Signed)
  Care Coordination   07/28/2022 Name: Natalie Ware MRN: [id number] DOB: 04-Feb-1966   Care Coordination Outreach Attempts:  An unsuccessful telephone outreach was attempted today to offer the patient information about available care coordination services as a benefit of their health plan.   Follow Up Plan:  Additional outreach attempts will be made to offer the patient care coordination information and services.   Encounter Outcome:  No Answer  Care Coordination Interventions Activated:  No   Care Coordination Interventions:  No, not indicated    Enzo Montgomery, RN,BSN,CCM [facility] Telephonic Care Management Coordinator Direct Phone: [phone] Toll Free: [phone] Fax: [phone]

## 2022-08-05 ENCOUNTER — Telehealth: Payer: Self-pay

## 2022-08-05 NOTE — Patient Outreach (Signed)
  Care Coordination   08/05/2022 Name: Natalie Ware MRN: [id number] DOB: 05-26-1966   Care Coordination Outreach Attempts:  A second unsuccessful outreach was attempted today to offer the patient with information about available care coordination services as a benefit of their health plan.     Follow Up Plan:  Additional outreach attempts will be made to offer the patient care coordination information and services.   Encounter Outcome:  No Answer   Care Coordination Interventions:  No, not indicated    Enzo Montgomery, RN,BSN,CCM [facility] Telephonic Care Management Coordinator Direct Phone: [phone] Toll Free: [phone] Fax: [phone]

## 2022-08-07 DIAGNOSIS — E103511 Type 1 diabetes mellitus with proliferative diabetic retinopathy with macular edema, right eye: Secondary | ICD-10-CM | POA: Diagnosis not present

## 2022-08-07 DIAGNOSIS — H35373 Puckering of macula, bilateral: Secondary | ICD-10-CM | POA: Diagnosis not present

## 2022-08-07 DIAGNOSIS — H43811 Vitreous degeneration, right eye: Secondary | ICD-10-CM | POA: Diagnosis not present

## 2022-08-07 DIAGNOSIS — H4311 Vitreous hemorrhage, right eye: Secondary | ICD-10-CM | POA: Diagnosis not present

## 2022-08-07 DIAGNOSIS — E103513 Type 1 diabetes mellitus with proliferative diabetic retinopathy with macular edema, bilateral: Secondary | ICD-10-CM | POA: Diagnosis not present

## 2022-08-13 ENCOUNTER — Telehealth: Payer: Self-pay

## 2022-08-13 NOTE — Patient Outreach (Signed)
  Care Coordination   08/13/2022 Name: Natalie Ware MRN: [id number] DOB: 1966/01/10   Care Coordination Outreach Attempts:  A third unsuccessful outreach was attempted today to offer the patient with information about available care coordination services as a benefit of their health plan.   Follow Up Plan:  No further outreach attempts will be made at this time. We have been unable to contact the patient to offer or enroll patient in care coordination services  Encounter Outcome:  No Answer   Care Coordination Interventions:  No, not indicated    Enzo Montgomery, RN,BSN,CCM [facility] Telephonic Care Management Coordinator Direct Phone: [phone] Toll Free: [phone] Fax: [phone]
